# Patient Record
Sex: Female | Born: 1985 | Race: Black or African American | Hispanic: No | Marital: Single | Smoking: Never smoker
Health system: Southern US, Community
[De-identification: ages and names within clinical notes are randomized; demographics above are authoritative.]

## PROBLEM LIST (undated history)

## (undated) ENCOUNTER — Inpatient Hospital Stay (HOSPITAL_COMMUNITY): Payer: Self-pay

## (undated) DIAGNOSIS — O139 Gestational [pregnancy-induced] hypertension without significant proteinuria, unspecified trimester: Secondary | ICD-10-CM

## (undated) HISTORY — PX: MOUTH SURGERY: SHX715

---
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
## 2007-12-16 ENCOUNTER — Emergency Department (HOSPITAL_COMMUNITY): Admission: EM | Admit: 2007-12-16 | Discharge: 2007-12-16 | Payer: Self-pay | Admitting: Emergency Medicine

## 2009-06-30 DIAGNOSIS — O321XX0 Maternal care for breech presentation, not applicable or unspecified: Secondary | ICD-10-CM

## 2010-08-28 ENCOUNTER — Emergency Department (HOSPITAL_COMMUNITY): Payer: Self-pay

## 2010-08-28 ENCOUNTER — Emergency Department (HOSPITAL_COMMUNITY)
Admission: EM | Admit: 2010-08-28 | Discharge: 2010-08-29 | Disposition: A | Discharge status: Other Acute Inpatient Hospital | Payer: Self-pay | Source: Home / Self Care | Attending: Emergency Medicine | Admitting: Emergency Medicine

## 2010-08-28 DIAGNOSIS — IMO0002 Reserved for concepts with insufficient information to code with codable children: Secondary | ICD-10-CM | POA: Insufficient documentation

## 2010-08-28 DIAGNOSIS — A499 Bacterial infection, unspecified: Secondary | ICD-10-CM | POA: Insufficient documentation

## 2010-08-28 DIAGNOSIS — N390 Urinary tract infection, site not specified: Secondary | ICD-10-CM | POA: Insufficient documentation

## 2010-08-28 DIAGNOSIS — N949 Unspecified condition associated with female genital organs and menstrual cycle: Secondary | ICD-10-CM | POA: Insufficient documentation

## 2010-08-28 DIAGNOSIS — O009 Unspecified ectopic pregnancy without intrauterine pregnancy: Secondary | ICD-10-CM | POA: Insufficient documentation

## 2010-08-28 DIAGNOSIS — B9689 Other specified bacterial agents as the cause of diseases classified elsewhere: Secondary | ICD-10-CM | POA: Insufficient documentation

## 2010-08-28 DIAGNOSIS — N760 Acute vaginitis: Secondary | ICD-10-CM | POA: Insufficient documentation

## 2010-08-28 LAB — URINALYSIS, ROUTINE W REFLEX MICROSCOPIC
Ketones, ur: 15 mg/dL — AB
Nitrite: NEGATIVE
Protein, ur: 30 mg/dL — AB
Urobilinogen, UA: 1 mg/dL (ref 0.0–1.0)
pH: 6 (ref 5.0–8.0)

## 2010-08-28 LAB — COMPREHENSIVE METABOLIC PANEL
AST: 19 U/L (ref 0–37)
Albumin: 3.8 g/dL (ref 3.5–5.2)
BUN: 9 mg/dL (ref 6–23)
Chloride: 103 mEq/L (ref 96–112)
Creatinine, Ser: 0.65 mg/dL (ref 0.4–1.2)
GFR calc Af Amer: >60 mL/min (ref >60)
Potassium: 3.7 mEq/L (ref 3.5–5.1)
Total Protein: 8.2 g/dL (ref 6.0–8.3)

## 2010-08-28 LAB — HCG, QUANTITATIVE, PREGNANCY: hCG, Beta Chain, Quant, S: 1371 m[IU]/mL — ABNORMAL HIGH (ref <5)

## 2010-08-28 LAB — WET PREP, GENITAL

## 2010-08-28 LAB — CBC
HCT: 32.2 % — ABNORMAL LOW (ref 36.0–46.0)
Hemoglobin: 10 g/dL — ABNORMAL LOW (ref 12.0–15.0)
MCH: 22.1 pg — ABNORMAL LOW (ref 26.0–34.0)
MCHC: 31.1 g/dL (ref 30.0–36.0)
RBC: 4.52 MIL/uL (ref 3.87–5.11)

## 2010-08-28 LAB — DIFFERENTIAL
Basophils Relative: 0 % (ref 0–1)
Lymphocytes Relative: 33 % (ref 12–46)
Lymphs Abs: 2.1 10*3/uL (ref 0.7–4.0)
Monocytes Absolute: 0.6 10*3/uL (ref 0.1–1.0)
Monocytes Relative: 9 % (ref 3–12)
Neutro Abs: 3.5 10*3/uL (ref 1.7–7.7)
Neutrophils Relative %: 57 % (ref 43–77)

## 2010-08-28 LAB — ABO/RH: ABO/RH(D): O POS

## 2010-08-28 LAB — POCT PREGNANCY, URINE: Preg Test, Ur: POSITIVE

## 2010-08-29 ENCOUNTER — Inpatient Hospital Stay (HOSPITAL_COMMUNITY)
Admission: AD | Admit: 2010-08-29 | Discharge: 2010-08-29 | DRG: 781 | Disposition: A | Discharge status: Home / Self Care | Payer: Self-pay | Source: Ambulatory Visit | Attending: Obstetrics and Gynecology | Admitting: Obstetrics and Gynecology

## 2010-08-29 DIAGNOSIS — R109 Unspecified abdominal pain: Secondary | ICD-10-CM | POA: Diagnosis present

## 2010-08-29 DIAGNOSIS — O2390 Unspecified genitourinary tract infection in pregnancy, unspecified trimester: Principal | ICD-10-CM | POA: Diagnosis present

## 2010-08-29 DIAGNOSIS — B9689 Other specified bacterial agents as the cause of diseases classified elsewhere: Secondary | ICD-10-CM | POA: Diagnosis present

## 2010-08-29 DIAGNOSIS — N390 Urinary tract infection, site not specified: Secondary | ICD-10-CM | POA: Diagnosis present

## 2010-08-29 DIAGNOSIS — O99019 Anemia complicating pregnancy, unspecified trimester: Secondary | ICD-10-CM | POA: Diagnosis present

## 2010-08-29 DIAGNOSIS — D649 Anemia, unspecified: Secondary | ICD-10-CM | POA: Diagnosis present

## 2010-08-29 DIAGNOSIS — N760 Acute vaginitis: Secondary | ICD-10-CM | POA: Diagnosis present

## 2010-08-29 DIAGNOSIS — A499 Bacterial infection, unspecified: Secondary | ICD-10-CM | POA: Diagnosis present

## 2010-08-29 DIAGNOSIS — O00109 Unspecified tubal pregnancy without intrauterine pregnancy: Secondary | ICD-10-CM | POA: Diagnosis present

## 2010-08-29 LAB — COMPREHENSIVE METABOLIC PANEL
ALT: 12 U/L (ref 0–35)
Alkaline Phosphatase: 47 U/L (ref 39–117)
CO2: 28 mEq/L (ref 19–32)
Chloride: 103 mEq/L (ref 96–112)
GFR calc non Af Amer: >60 mL/min (ref >60)
Glucose, Bld: 90 mg/dL (ref 70–99)
Potassium: 3.7 mEq/L (ref 3.5–5.1)
Sodium: 136 mEq/L (ref 135–145)
Total Bilirubin: 0.4 mg/dL (ref 0.3–1.2)
Total Protein: 7.3 g/dL (ref 6.0–8.3)

## 2010-08-29 LAB — CBC
HCT: 29.9 % — ABNORMAL LOW (ref 36.0–46.0)
Hemoglobin: 9 g/dL — ABNORMAL LOW (ref 12.0–15.0)
MCH: 21.9 pg — ABNORMAL LOW (ref 26.0–34.0)
MCV: 72.7 fL — ABNORMAL LOW (ref 78.0–100.0)
RBC: 4.11 MIL/uL (ref 3.87–5.11)
WBC: 4.1 10*3/uL (ref 4.0–10.5)

## 2010-08-29 LAB — DIFFERENTIAL
Lymphocytes Relative: 39 % (ref 12–46)
Lymphs Abs: 1.6 10*3/uL (ref 0.7–4.0)
Monocytes Relative: 14 % — ABNORMAL HIGH (ref 3–12)
Neutrophils Relative %: 46 % (ref 43–77)

## 2010-08-30 LAB — URINE CULTURE

## 2010-08-30 LAB — GC/CHLAMYDIA PROBE AMP, GENITAL: Chlamydia, DNA Probe: NEGATIVE

## 2010-09-04 ENCOUNTER — Inpatient Hospital Stay (HOSPITAL_COMMUNITY)
Admission: AD | Admit: 2010-09-04 | Discharge: 2010-09-04 | Disposition: A | Discharge status: Home / Self Care | Payer: Self-pay | Source: Ambulatory Visit | Attending: Obstetrics and Gynecology | Admitting: Obstetrics and Gynecology

## 2010-09-04 ENCOUNTER — Inpatient Hospital Stay (HOSPITAL_COMMUNITY): Payer: Self-pay

## 2010-09-04 DIAGNOSIS — R109 Unspecified abdominal pain: Secondary | ICD-10-CM

## 2010-09-04 DIAGNOSIS — O9989 Other specified diseases and conditions complicating pregnancy, childbirth and the puerperium: Secondary | ICD-10-CM

## 2010-09-04 DIAGNOSIS — O99891 Other specified diseases and conditions complicating pregnancy: Secondary | ICD-10-CM | POA: Insufficient documentation

## 2010-09-04 LAB — CBC
HCT: 30.3 % — ABNORMAL LOW (ref 36.0–46.0)
RBC: 4.16 MIL/uL (ref 3.87–5.11)
RDW: 17.8 % — ABNORMAL HIGH (ref 11.5–15.5)
WBC: 5.4 10*3/uL (ref 4.0–10.5)

## 2011-02-20 ENCOUNTER — Inpatient Hospital Stay (INDEPENDENT_AMBULATORY_CARE_PROVIDER_SITE_OTHER)
Admission: RE | Admit: 2011-02-20 | Discharge: 2011-02-20 | Disposition: A | Discharge status: Home / Self Care | Payer: Self-pay | Source: Ambulatory Visit | Attending: Family Medicine | Admitting: Family Medicine

## 2011-02-20 DIAGNOSIS — L538 Other specified erythematous conditions: Secondary | ICD-10-CM

## 2011-02-20 DIAGNOSIS — N390 Urinary tract infection, site not specified: Secondary | ICD-10-CM

## 2011-02-20 LAB — POCT URINALYSIS DIP (DEVICE)
Bilirubin Urine: NEGATIVE
Glucose, UA: NEGATIVE mg/dL
Ketones, ur: NEGATIVE mg/dL
Nitrite: NEGATIVE

## 2012-03-16 IMAGING — US US OB TRANSVAGINAL
1 series · 13 of 28 positions shown · non-contrast
Comparison: None

CLINICAL DATA: Early pregnancy. Left lower quadrant pain.

OBSTETRIC <14 WK US AND TRANSVAGINAL OB US
TECHNIQUE: Both transabdominal and transvaginal ultrasound
examinations were performed for complete evaluation of the
gestation as well as the maternal uterus, adnexal regions, and
pelvic cul-de-sac.  Transvaginal technique was performed to assess
early pregnancy.

[Series 1: us ob transvaginal · 0.21mm/px · 13 of 64 slices shown]
[im 3/64]
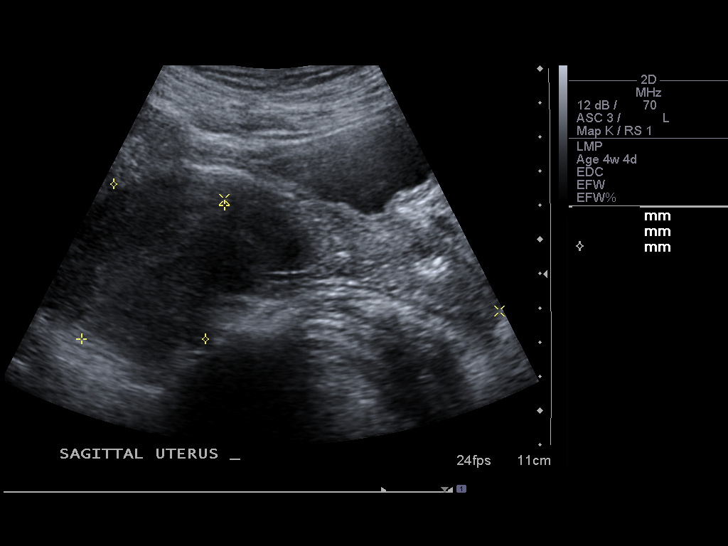
[im 8/64]
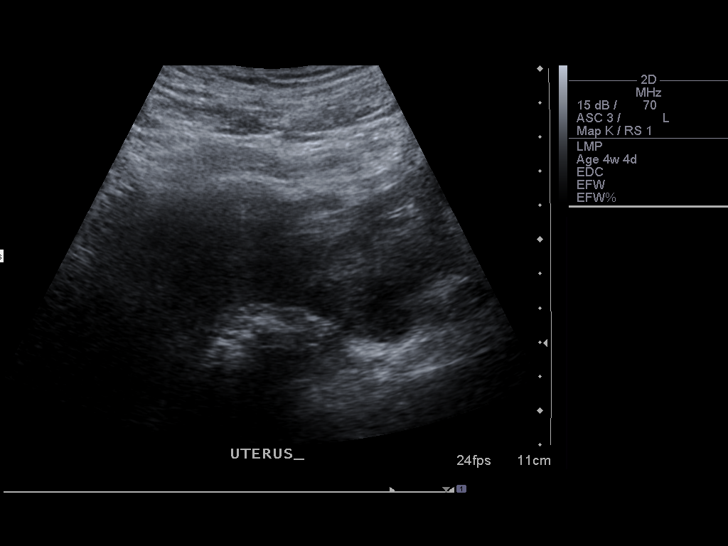
[im 12/64]
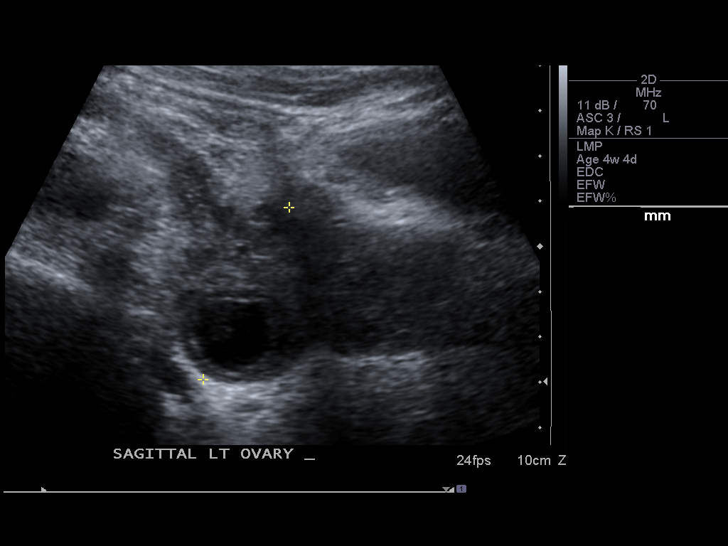
[im 17/64]
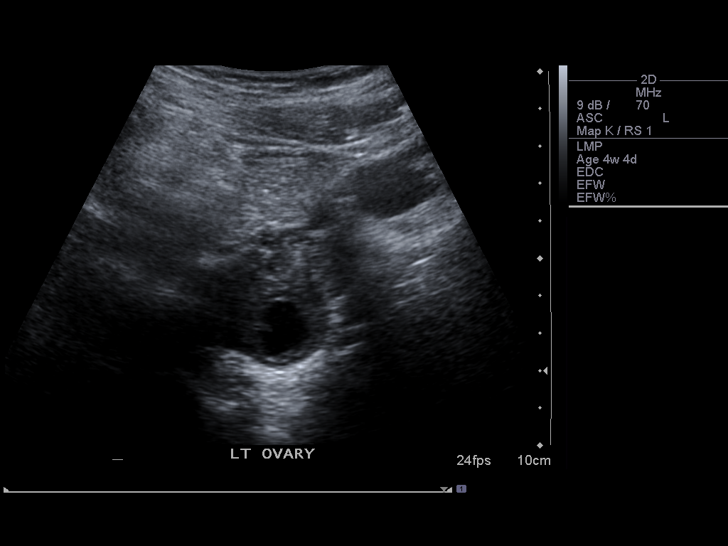
[im 22/64]
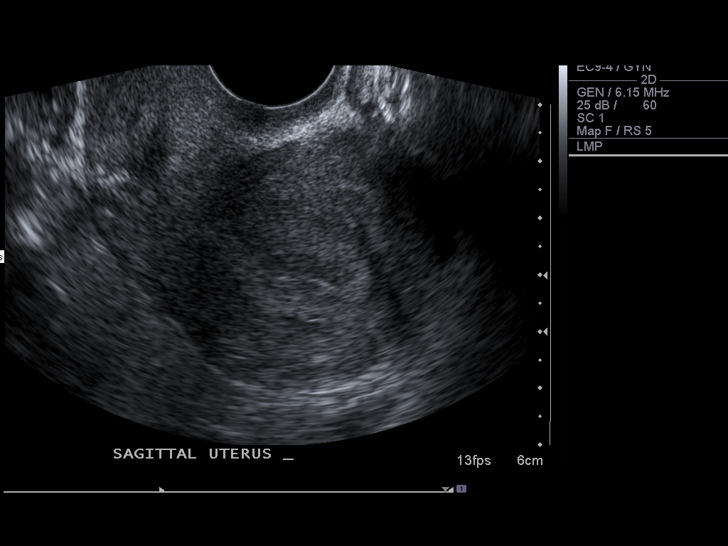
[im 26/64]
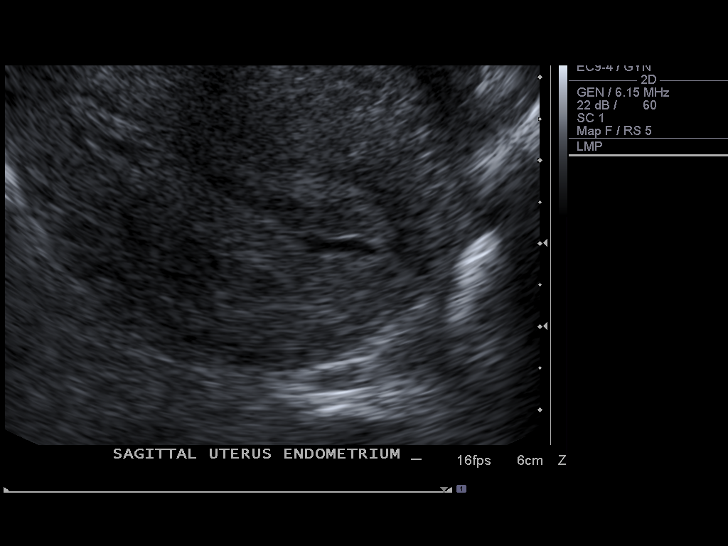
[im 33/64]
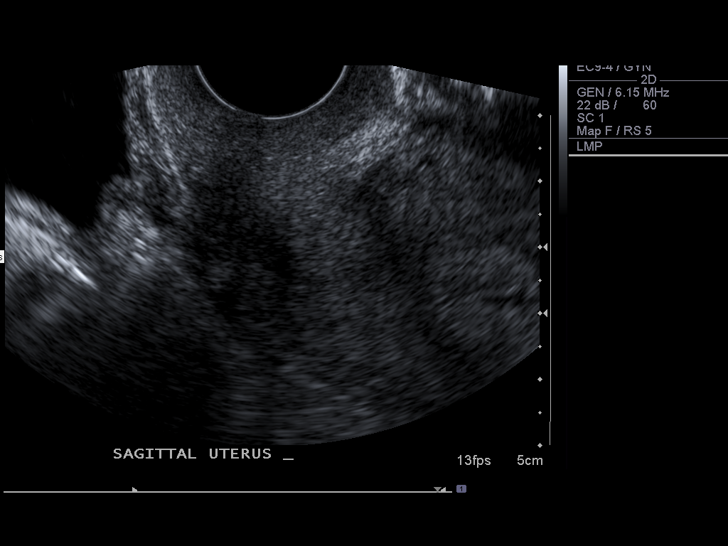
[im 38/64]
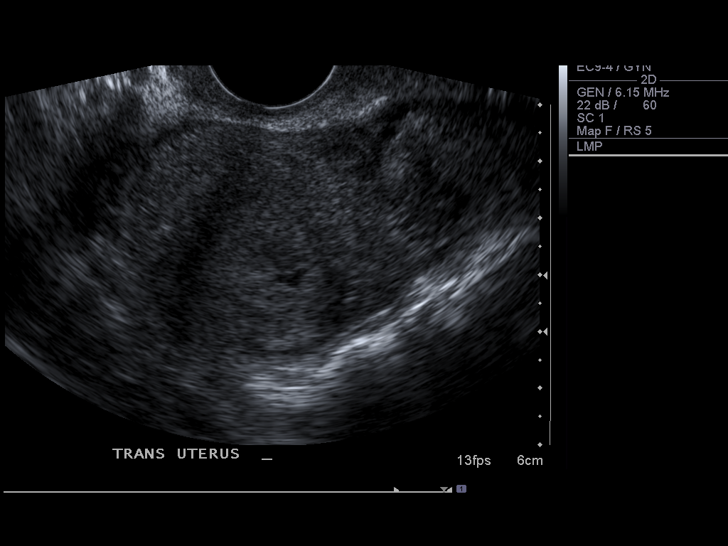
[im 43/64]
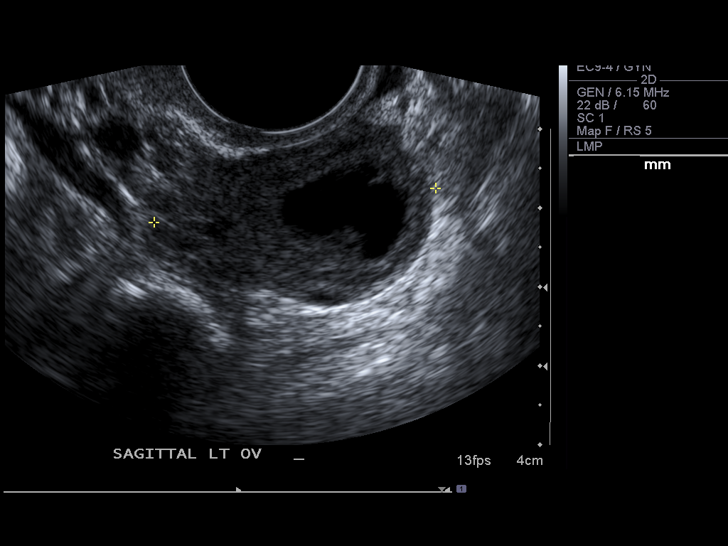
[im 47/64]
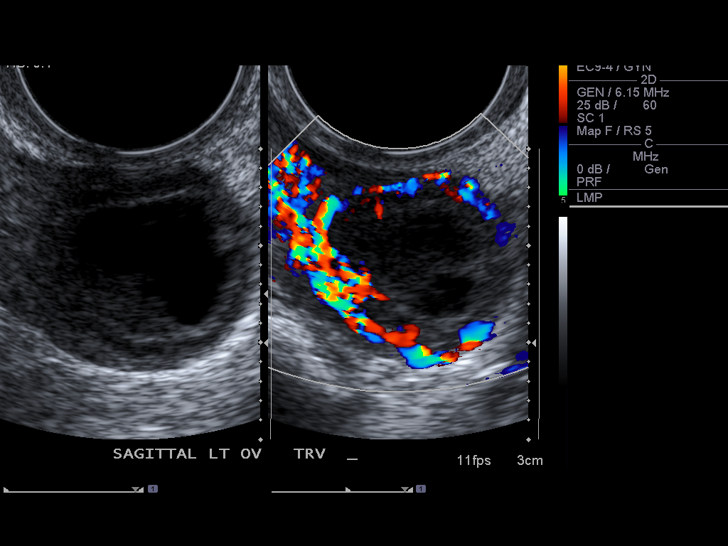
[im 52/64]
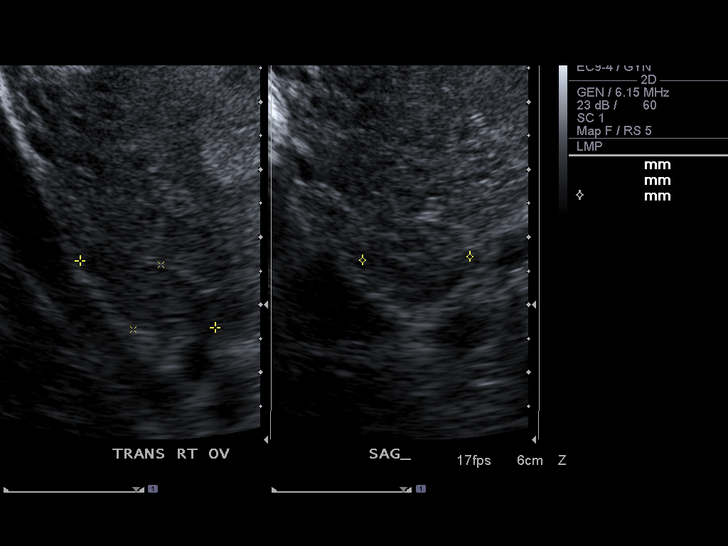
[im 57/64]
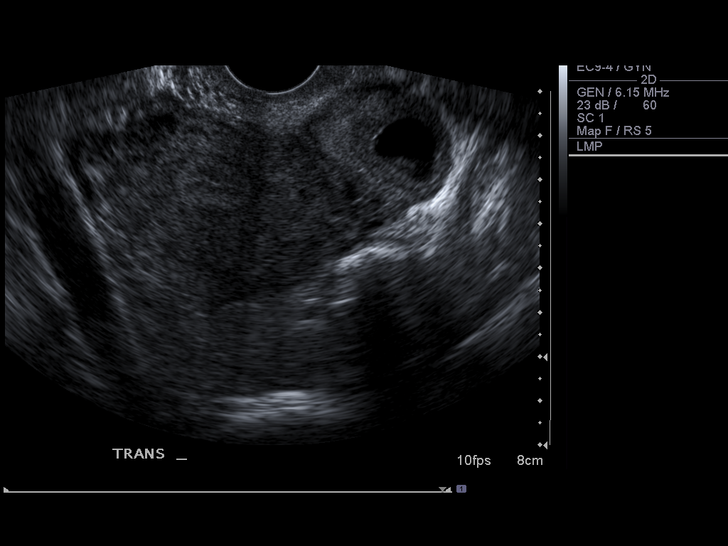
[im 61/64]
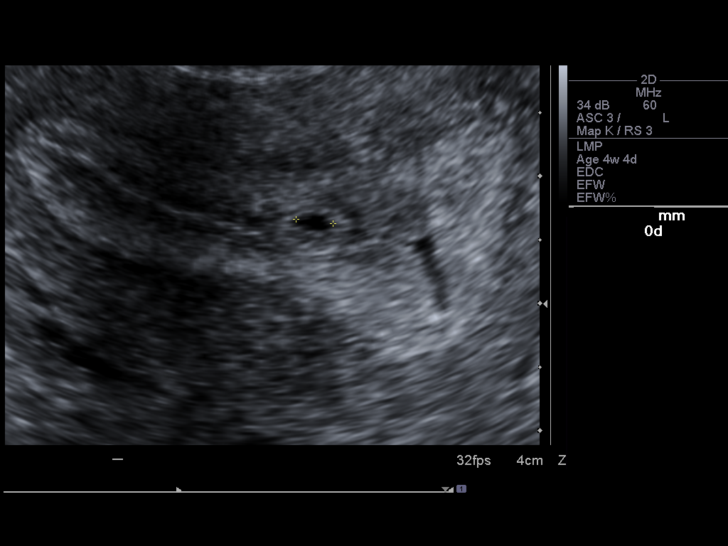

[13 of 28 positions shown; findings below may reference images not displayed]

There is no definite intrauterine gestational sac.  There is a tiny
amount of fluid in the endometrial canal.  There is a 2 x 3 mm bit
of fluid within the endometrium that could represent the earliest
manifestation of a gestational sac.  There is no discernible
contents within that.

The right ovary is normal measuring 2.2 x 1.0 x 1.6 cm.  Normal
appearing blood flow.

The left ovary contains a thick-walled cystic area.  The ovary and
total measures 3.6 x 3.6 x 2.3 cm.  The cystic area measures 2 x
1.5 x 1.9 cm.  There is prominent blood flow around the margin of
this.  This could represent a functional cyst or a corpus luteal
cyst.  The possibility of an ovarian ectopic does exist.  Follow up
should be performed.  There is a small amount of free fluid.
IMPRESSION: No definite intrauterine gestational sac.  There is a small amount
of free type fluid within the endometrial canal.  There is a 2 x 3
mm fluid area associated with the endometrium that could represent
the earliest manifestation of gestational sac. This is not
definite.  There are no internal contents identifiable at this
time.

Abnormal appearance of the left ovary.  There is a thick walled
peripherally hypervascular cystic area measuring 2 x 1.5 x 1.9 cm.
This could represent a functional cyst or corpus luteal cyst or
could possibly represent an ovarian ectopic.

Only a small amount of free fluid in the cul-de-sac.

Further close follow-up is warranted.

## 2012-08-22 ENCOUNTER — Encounter (HOSPITAL_COMMUNITY): Payer: Self-pay | Admitting: Emergency Medicine

## 2012-08-22 ENCOUNTER — Emergency Department (INDEPENDENT_AMBULATORY_CARE_PROVIDER_SITE_OTHER)
Admission: EM | Admit: 2012-08-22 | Discharge: 2012-08-22 | Disposition: A | Discharge status: Home / Self Care | Payer: Self-pay | Source: Home / Self Care | Attending: Family Medicine | Admitting: Family Medicine

## 2012-08-22 DIAGNOSIS — L42 Pityriasis rosea: Secondary | ICD-10-CM

## 2012-08-22 MED ORDER — TRIAMCINOLONE ACETONIDE 0.5 % EX OINT: TOPICAL_OINTMENT | Freq: Two times a day (BID) | CUTANEOUS | Status: DC

## 2012-08-22 MED ORDER — VALACYCLOVIR HCL 500 MG PO TABS: 500.0000 mg | ORAL_TABLET | Freq: Two times a day (BID) | ORAL | Status: DC

## 2012-08-22 MED ORDER — HYDROXYZINE HCL 50 MG PO TABS: 50.0000 mg | ORAL_TABLET | Freq: Three times a day (TID) | ORAL | Status: DC | PRN

## 2012-08-22 NOTE — ED Notes (Signed)
Pt c/o rash on torso x 3 days. Has tried oatmeal scrub with no relief. Spots seem to be getting bigger. Very red and itchy. Used Gain laundry detergent which is new for her. Not sure if this is due to switch in detergent. No SOB or fever. Pt is alert and oriented.

## 2012-08-29 NOTE — ED Provider Notes (Signed)
History     CSN: [id number]  Arrival date & time 08/22/12  1042   First MD Initiated Contact with Patient 08/22/12 1102      Chief Complaint  Patient presents with  . Rash    (Consider location/radiation/quality/duration/timing/severity/associated sxs/prior treatment) Patient is a 27 y.o. female presenting with rash. The history is provided by the patient.  Rash Location:  Torso Torso rash location:  Upper back, lower back, L chest and R chest Quality: itchiness and redness   Quality: not blistering, not draining and not weeping   Severity:  Moderate Onset quality:  Sudden Duration:  3 days Timing:  Constant Progression:  Unchanged Chronicity:  New Context: new detergent/soap   Context: not chemical exposure, not exposure to similar rash, not food and not medications   Relieved by:  Anti-itch cream Ineffective treatments:  Moisturizers Associated symptoms: no abdominal pain, no fatigue, no fever, no headaches, no joint pain, no nausea, no shortness of breath, no sore throat, no throat swelling, no tongue swelling and not wheezing     History reviewed. No pertinent past medical history.  Past Surgical History  Procedure Laterality Date  . Cesarean section      No family history on file.  History  Substance Use Topics  . Smoking status: Never Smoker   . Smokeless tobacco: Not on file  . Alcohol Use: No    OB History   Grav Para Term Preterm Abortions TAB SAB Ect Mult Living                  Review of Systems  Constitutional: Negative for fever, chills, appetite change and fatigue.  HENT: Negative for sore throat.   Eyes: Negative for itching.  Respiratory: Negative for shortness of breath and wheezing.   Gastrointestinal: Negative for nausea and abdominal pain.  Musculoskeletal: Negative for arthralgias.  Skin: Positive for rash.  Neurological: Negative for headaches.  All other systems reviewed and are negative.    Allergies  Review of patient's  allergies indicates no known allergies.  Home Medications   Current Outpatient Rx  Name  Route  Sig  Dispense  Refill  . hydrOXYzine (ATARAX/VISTARIL) 50 MG tablet   Oral   Take 1 tablet (50 mg total) by mouth every 8 (eight) hours as needed for itching.   20 tablet   0   . triamcinolone ointment (KENALOG) 0.5 %   Topical   Apply topically 2 (two) times daily.   30 g   0   . valACYclovir (VALTREX) 500 MG tablet   Oral   Take 1 tablet (500 mg total) by mouth 2 (two) times daily.   14 tablet   0     BP 136/70  Pulse 82  Temp(Src) 98.1 F (36.7 C) (Oral)  Resp 16  SpO2 100%  LMP 07/26/2012  Physical Exam  Vitals reviewed. Constitutional: She is oriented to person, place, and time. She appears well-developed and well-nourished. No distress.  HENT:  Mouth/Throat: Oropharynx is clear and moist. No oropharyngeal exudate.  Eyes: Conjunctivae are normal. Right eye exhibits no discharge. Left eye exhibits no discharge. No scleral icterus.  Neck: No thyromegaly present.  Cardiovascular: Normal heart sounds.   Pulmonary/Chest: Breath sounds normal.  Abdominal: Soft. She exhibits no mass. There is no tenderness.  Lymphadenopathy:    She has no cervical adenopathy.  Neurological: She is alert and oriented to person, place, and time.  Skin: She is not diaphoretic.  Macular pruriginous patches of different size  in christmas tree pattern distribution over upper and lower back and anterior torso. Also few lesions in proximal extremities. No palm or sole involvement. No vesicles. No pustules.     ED Course  Procedures (including critical care time)  Labs Reviewed - No data to display No results found.   1. Pityriasis rosea       MDM  Treated with vistaril, triamcinolone ointment and valacyclovir. Supportive care and red flags that should prompt return to medical attention discussed with patient and provided in writing.         Sharin Grave, MD 08/29/12  [phone]

## 2013-01-23 ENCOUNTER — Encounter (HOSPITAL_COMMUNITY): Payer: Self-pay | Admitting: Family Medicine

## 2013-01-23 ENCOUNTER — Emergency Department (HOSPITAL_COMMUNITY)
Admission: EM | Admit: 2013-01-23 | Discharge: 2013-01-23 | Disposition: A | Discharge status: Home / Self Care | Payer: Self-pay | Attending: Emergency Medicine | Admitting: Emergency Medicine

## 2013-01-23 ENCOUNTER — Emergency Department (HOSPITAL_COMMUNITY): Payer: Self-pay

## 2013-01-23 ENCOUNTER — Emergency Department (INDEPENDENT_AMBULATORY_CARE_PROVIDER_SITE_OTHER)
Admission: EM | Admit: 2013-01-23 | Discharge: 2013-01-23 | Disposition: A | Discharge status: Nursing Home (Includes ICF) | Payer: Self-pay | Source: Home / Self Care | Attending: Family Medicine | Admitting: Family Medicine

## 2013-01-23 ENCOUNTER — Encounter (HOSPITAL_COMMUNITY): Payer: Self-pay | Admitting: Emergency Medicine

## 2013-01-23 DIAGNOSIS — Z79899 Other long term (current) drug therapy: Secondary | ICD-10-CM | POA: Insufficient documentation

## 2013-01-23 DIAGNOSIS — N949 Unspecified condition associated with female genital organs and menstrual cycle: Secondary | ICD-10-CM | POA: Insufficient documentation

## 2013-01-23 DIAGNOSIS — R102 Pelvic and perineal pain: Secondary | ICD-10-CM

## 2013-01-23 DIAGNOSIS — N898 Other specified noninflammatory disorders of vagina: Secondary | ICD-10-CM | POA: Insufficient documentation

## 2013-01-23 DIAGNOSIS — R110 Nausea: Secondary | ICD-10-CM | POA: Insufficient documentation

## 2013-01-23 LAB — COMPREHENSIVE METABOLIC PANEL
Alkaline Phosphatase: 52 U/L (ref 39–117)
BUN: 9 mg/dL (ref 6–23)
CO2: 25 mEq/L (ref 19–32)
Chloride: 101 mEq/L (ref 96–112)
Creatinine, Ser: 0.68 mg/dL (ref 0.50–1.10)
GFR calc non Af Amer: >90 mL/min (ref >90)
Glucose, Bld: 88 mg/dL (ref 70–99)
Potassium: 4 mEq/L (ref 3.5–5.1)
Total Bilirubin: 0.2 mg/dL — ABNORMAL LOW (ref 0.3–1.2)

## 2013-01-23 LAB — URINALYSIS, ROUTINE W REFLEX MICROSCOPIC
Glucose, UA: NEGATIVE mg/dL
Hgb urine dipstick: NEGATIVE
Ketones, ur: 15 mg/dL — AB
Protein, ur: NEGATIVE mg/dL
Urobilinogen, UA: 0.2 mg/dL (ref 0.0–1.0)

## 2013-01-23 LAB — CBC WITH DIFFERENTIAL/PLATELET
HCT: 35.4 % — ABNORMAL LOW (ref 36.0–46.0)
Hemoglobin: 11.1 g/dL — ABNORMAL LOW (ref 12.0–15.0)
Lymphocytes Relative: 37 % (ref 12–46)
Lymphs Abs: 2.1 10*3/uL (ref 0.7–4.0)
MCHC: 31.4 g/dL (ref 30.0–36.0)
Monocytes Absolute: 0.5 10*3/uL (ref 0.1–1.0)
Monocytes Relative: 8 % (ref 3–12)
Neutro Abs: 3.1 10*3/uL (ref 1.7–7.7)
Neutrophils Relative %: 53 % (ref 43–77)
RBC: 4.6 MIL/uL (ref 3.87–5.11)
WBC: 5.8 10*3/uL (ref 4.0–10.5)

## 2013-01-23 LAB — POCT URINALYSIS DIP (DEVICE)
Bilirubin Urine: NEGATIVE
Ketones, ur: NEGATIVE mg/dL
Protein, ur: NEGATIVE mg/dL
Specific Gravity, Urine: 1.02 (ref 1.005–1.030)

## 2013-01-23 LAB — URINE MICROSCOPIC-ADD ON

## 2013-01-23 LAB — POCT PREGNANCY, URINE: Preg Test, Ur: NEGATIVE

## 2013-01-23 MED ORDER — HYDROCODONE-ACETAMINOPHEN 5-325 MG PO TABS: 1.0000 | ORAL_TABLET | ORAL | Status: DC | PRN

## 2013-01-23 NOTE — ED Provider Notes (Signed)
CSN: [id number]     Arrival date & time 01/23/13  1119 History   First MD Initiated Contact with Patient 01/23/13 1218     Chief Complaint  Patient presents with  . Abdominal Pain   (Consider location/radiation/quality/duration/timing/severity/associated sxs/prior Treatment) HPI Patient referred from urgent care center for left lower quadrant pain for the past 3 days. Patient says pain came on suddenly but has waxed and waned. The pain is worse with movement and abdominal extension. She has no fevers or chills. She states that the pain is extreme but she does have some nausea. She denies any urinary symptoms. She sways late for her menstrual cycle. She's had a cesarean section in the past no other abdominal surgeries. Just has a history of left ovarian cyst but reportedly feels very similar to this. History reviewed. No pertinent past medical history. Past Surgical History  Procedure Laterality Date  . Cesarean section     History reviewed. No pertinent family history. History  Substance Use Topics  . Smoking status: Never Smoker   . Smokeless tobacco: Not on file  . Alcohol Use: No   OB History   Grav Para Term Preterm Abortions TAB SAB Ect Mult Living                 Review of Systems  Constitutional: Negative for fever and chills.  Respiratory: Negative for shortness of breath.   Cardiovascular: Negative for chest pain.  Gastrointestinal: Positive for nausea and abdominal pain. Negative for vomiting, diarrhea and constipation.  Genitourinary: Positive for vaginal discharge. Negative for dysuria, hematuria, flank pain and vaginal bleeding.  Musculoskeletal: Negative for back pain.  Skin: Negative for rash and wound.  Neurological: Negative for dizziness, seizures, syncope, weakness, light-headedness, numbness and headaches.  All other systems reviewed and are negative.    Allergies  Review of patient's allergies indicates no known allergies.  Home Medications   Current  Outpatient Rx  Name  Route  Sig  Dispense  Refill  . hydrOXYzine (ATARAX/VISTARIL) 50 MG tablet   Oral   Take 1 tablet (50 mg total) by mouth every 8 (eight) hours as needed for itching.   20 tablet   0   . triamcinolone ointment (KENALOG) 0.5 %   Topical   Apply topically 2 (two) times daily.   30 g   0   . valACYclovir (VALTREX) 500 MG tablet   Oral   Take 1 tablet (500 mg total) by mouth 2 (two) times daily.   14 tablet   0    BP 157/75  Pulse 76  Temp(Src) 98.2 F (36.8 C) (Oral)  Resp 20  Ht 5\' 9"  (1.753 m)  Wt 187 lb (84.823 kg)  BMI 27.6 kg/m2  SpO2 100%  LMP 12/17/2012 Physical Exam  Nursing note and vitals reviewed. Constitutional: She is oriented to person, place, and time. She appears well-developed and well-nourished. No distress.  HENT:  Head: Normocephalic and atraumatic.  Mouth/Throat: Oropharynx is clear and moist.  Eyes: EOM are normal. Pupils are equal, round, and reactive to light.  Neck: Normal range of motion. Neck supple.  Cardiovascular: Normal rate and regular rhythm.   Pulmonary/Chest: Effort normal and breath sounds normal. No respiratory distress. She has no wheezes. She has no rales.  Abdominal: Soft. Bowel sounds are normal. She exhibits no distension and no mass. There is tenderness (tenderness to palpation in the left adnexal region more so than the left lower quadrant. She has no rebound or guarding. No appreciated  masses.). There is no rebound and no guarding.  Genitourinary: Vaginal discharge found.  Visual no cervical motion tenderness. She has moderate left adnexal tenderness. No appreciated masses. Normal white vaginal discharge.  Musculoskeletal: Normal range of motion. She exhibits no edema and no tenderness.  Neurological: She is alert and oriented to person, place, and time.  Skin: Skin is warm and dry. No rash noted. No erythema.  Psychiatric: She has a normal mood and affect. Her behavior is normal.    ED Course  Procedures  (including critical care time) Labs Review Labs Reviewed  GC/CHLAMYDIA PROBE AMP  WET PREP, GENITAL  CBC WITH DIFFERENTIAL  COMPREHENSIVE METABOLIC PANEL  URINALYSIS, ROUTINE W REFLEX MICROSCOPIC   Imaging Review No results found.  MDM  A pelvic exam and get a pelvic ultrasound. To rule out any type of ovarian torsion. Pregnancy test done in the urgent care Center is negative. Patient with likely ovarian cyst rupture. No evidence of torsion. Labs are unremarkable. I do not think the patient has an intra-abdominal process. We'll treat her pain discharge her home and have her follow up with OB/GYN.  Loren Racer, MD 01/23/13 1447

## 2013-01-23 NOTE — ED Notes (Signed)
Pt c/o LLQ abd pain onset 3 days... Reports hx of ovarian cyst on left  Pain radiates to middle of abd and increases w/activity or when she sits a certain way Also states she is late on her menstrual cycle... LMP = 8/18 Denies: f/v/n/d, vag d/c, hematuria, dysuria Alert w/no signs of acute distress.

## 2013-01-23 NOTE — ED Notes (Signed)
Pt sent here from [facility] for [place]. complaining of LLQ pain with nausea and hx of ovarian cyst. Denies V,D. Denies vaginal bleeding or discharge. sts pain increased with movement.

## 2013-01-23 NOTE — ED Provider Notes (Signed)
CSN: [id number]     Arrival date & time 01/23/13  5284 History   First MD Initiated Contact with Patient 01/23/13 0957     Chief Complaint  Patient presents with  . Abdominal Pain   (Consider location/radiation/quality/duration/timing/severity/associated sxs/prior Treatment) HPI Comments: 27 y/o female with no significant past medical history. [id number]. H/o delivery by C-section. Here c/o LLQ worsening pain for 3 days associated with nausea, no vomiting. Pain worse with movement. Had a pink vaginal discharge last week but denies current vaginal bleeding or vaginal discharge. last bowel movement normal as per her report yesterday and today. Denies fever or chills, also denies dysuria or other urinary symptoms. Reports 4 days delay of her menstrual period and admits to unprotected sex in last 6 months. Patient reports similar symptoms recently for what she was seen at [facility] and had an ultrasound showing a functional left ovarian cyst as per her report.    History reviewed. No pertinent past medical history. Past Surgical History  Procedure Laterality Date  . Cesarean section     No family history on file. History  Substance Use Topics  . Smoking status: Never Smoker   . Smokeless tobacco: Not on file  . Alcohol Use: No   OB History   Grav Para Term Preterm Abortions TAB SAB Ect Mult Living                 Review of Systems  Constitutional: Negative for fever, chills, diaphoresis and fatigue.  Gastrointestinal: Positive for nausea and abdominal pain. Negative for vomiting, diarrhea, constipation, blood in stool and rectal pain.  Genitourinary: Positive for pelvic pain. Negative for dysuria, urgency, frequency, hematuria, vaginal bleeding, vaginal discharge and vaginal pain.  Neurological: Negative for dizziness and headaches.  All other systems reviewed and are negative.    Allergies  Review of patient's allergies indicates no known allergies.  Home Medications    Current Outpatient Rx  Name  Route  Sig  Dispense  Refill  . hydrOXYzine (ATARAX/VISTARIL) 50 MG tablet   Oral   Take 1 tablet (50 mg total) by mouth every 8 (eight) hours as needed for itching.   20 tablet   0   . triamcinolone ointment (KENALOG) 0.5 %   Topical   Apply topically 2 (two) times daily.   30 g   0   . valACYclovir (VALTREX) 500 MG tablet   Oral   Take 1 tablet (500 mg total) by mouth 2 (two) times daily.   14 tablet   0    BP 147/76  Pulse 77  Temp(Src) 98.3 F (36.8 C) (Oral)  Resp 16  SpO2 100%  LMP 12/17/2012 Physical Exam  Nursing note and vitals reviewed. Constitutional: She is oriented to person, place, and time. She appears well-developed and well-nourished.  Uncomfortable with movement.  HENT:  Head: Normocephalic and atraumatic.  Mouth/Throat: Oropharynx is clear and moist. No oropharyngeal exudate.  Eyes: No scleral icterus.  Neck: No tracheal deviation present.  Cardiovascular: Normal heart sounds.   Pulmonary/Chest: Breath sounds normal.  Abdominal: She exhibits no distension. There is tenderness. There is guarding.  LLQ tenderness with light palpation with guarding and impress rebound component although reported same degree of pain both with pressure and release. Patient unable to lay with legs extended on the examen table due to pain.   Neurological: She is alert and oriented to person, place, and time.  Skin: No rash noted. She is not diaphoretic.    ED Course  Procedures (including critical care time) Labs Review Labs Reviewed  POCT URINALYSIS DIP (DEVICE) - Abnormal; Notable for the following:    Nitrite POSITIVE (*)    All other components within normal limits  POCT PREGNANCY, URINE   Imaging Review No results found.  MDM   1. Pelvic pain    27 y/o female [id number]. Here c/o LLQ worsening pain for 3 days associated with nausea no vomiting, delayed menstrual period (4 days) and recent h/o of functional left ovarian  cyst. On exam: Vital signs are normal. Significant tenderness in left lower quadrant with guarding. Patient unable to lay with her leg straight in the exam table due to pain. Findings concerning for a ovarian torsion vs hemorrhagic or ruptured ovarian cyst or other intra-abdominal surgical pathology. Also possible early pregnancy. Decided to transfer to the emergency department for further evaluation and management.  Sharin Grave, MD 01/23/13 1109

## 2014-04-30 ENCOUNTER — Encounter (HOSPITAL_COMMUNITY): Payer: Self-pay

## 2014-04-30 ENCOUNTER — Inpatient Hospital Stay (HOSPITAL_COMMUNITY)
Admission: AD | Admit: 2014-04-30 | Discharge: 2014-04-30 | Disposition: A | Discharge status: Home / Self Care | Payer: Self-pay | Source: Ambulatory Visit | Attending: Obstetrics & Gynecology | Admitting: Obstetrics & Gynecology

## 2014-04-30 DIAGNOSIS — Z3201 Encounter for pregnancy test, result positive: Secondary | ICD-10-CM | POA: Insufficient documentation

## 2014-04-30 HISTORY — DX: Gestational (pregnancy-induced) hypertension without significant proteinuria, unspecified trimester: O13.9

## 2014-04-30 LAB — POCT PREGNANCY, URINE: Preg Test, Ur: POSITIVE — AB

## 2014-04-30 NOTE — MAU Note (Signed)
Urine in lab 

## 2014-04-30 NOTE — MAU Note (Signed)
Patient states she had a positive home pregnancy and wants confirmation. Denies pain, bleeding, discharge, nausea or vomiting.

## 2014-04-30 NOTE — MAU Provider Note (Signed)
S: 28 y.o. [id number] @[redacted]w[redacted]d  by LMP presents to MAU for pregnancy verification.  She denies abdominal pain or vaginal bleeding.    O: BP 137/74 mmHg  Pulse 84  Temp(Src) 99.1 F (37.3 C) (Oral)  Resp 16  Ht 5\' 9"  (1.753 m)  Wt 85.004 kg (187 lb 6.4 oz)  BMI 27.66 kg/m2  SpO2 100%  LMP 03/23/2014 Physical Examination: General appearance - alert, well appearing, and in no distress, oriented to person, place, and time and acyanotic, in no respiratory distress Results for orders placed or performed during the hospital encounter of 04/30/14 (from the past 24 hour(s))  Pregnancy, urine POC     Status: Abnormal   Collection Time: 04/30/14  2:33 PM  Result Value Ref Range   Preg Test, Ur POSITIVE (A) NEGATIVE   A: Positive pregnancy test  P: D/C home List of providers and pregnancy verification letter given F/U with early prenatal care Return to MAU as needed for emergencies  Sharen CounterLisa Leftwich-Kirby Certified Nurse-Midwife

## 2014-04-30 NOTE — MAU Note (Signed)
Patient outside when called to triage. Not in the waiting room

## 2014-05-02 NOTE — L&D Delivery Note (Cosign Needed)
Delivery Note At 10:16 AM a viable female was delivered via VBAC, Vacuum Assisted (3 pulls, one-pop-off) secondary to poor maternal effort / maternal exhaustion from the +2 station, (Presentation: ; Occiput Transverse).  APGAR: 9, 9; weight 7 lb 4.6 oz (3305 g).   Placenta status: Intact, Spontaneous.  Cord: 3 vessels with the following complications: PPH of 800 ml, likely secondary to retained placental membrane that was removed with ring forceps, uterus firm afterward, cytotec 1000 given rectally.  Cord pH: not obtained  Anesthesia: Epidural  Episiotomy: None Lacerations: 2nd degree Suture Repair: 3.0 vicryl Est. Blood Loss (mL): 843  Mom to postpartum.  Baby to Couplet care / Skin to Skin.  Cherrie Gauze Izear Pine 12/18/2014, 11:35 AM

## 2014-05-30 ENCOUNTER — Inpatient Hospital Stay (HOSPITAL_COMMUNITY)
Admission: AD | Admit: 2014-05-30 | Discharge: 2014-05-30 | Disposition: A | Discharge status: Home / Self Care | Payer: Self-pay | Source: Ambulatory Visit | Attending: Obstetrics and Gynecology | Admitting: Obstetrics and Gynecology

## 2014-05-30 ENCOUNTER — Inpatient Hospital Stay (HOSPITAL_COMMUNITY): Payer: Self-pay

## 2014-05-30 ENCOUNTER — Encounter (HOSPITAL_COMMUNITY): Payer: Self-pay | Admitting: *Deleted

## 2014-05-30 DIAGNOSIS — R109 Unspecified abdominal pain: Secondary | ICD-10-CM | POA: Insufficient documentation

## 2014-05-30 DIAGNOSIS — Z3491 Encounter for supervision of normal pregnancy, unspecified, first trimester: Secondary | ICD-10-CM

## 2014-05-30 DIAGNOSIS — O2341 Unspecified infection of urinary tract in pregnancy, first trimester: Secondary | ICD-10-CM

## 2014-05-30 DIAGNOSIS — O26899 Other specified pregnancy related conditions, unspecified trimester: Secondary | ICD-10-CM

## 2014-05-30 DIAGNOSIS — Z3A09 9 weeks gestation of pregnancy: Secondary | ICD-10-CM | POA: Insufficient documentation

## 2014-05-30 DIAGNOSIS — F17210 Nicotine dependence, cigarettes, uncomplicated: Secondary | ICD-10-CM | POA: Insufficient documentation

## 2014-05-30 DIAGNOSIS — O99331 Smoking (tobacco) complicating pregnancy, first trimester: Secondary | ICD-10-CM | POA: Insufficient documentation

## 2014-05-30 LAB — CBC
HCT: 32.7 % — ABNORMAL LOW (ref 36.0–46.0)
Hemoglobin: 10.8 g/dL — ABNORMAL LOW (ref 12.0–15.0)
MCH: 26.2 pg (ref 26.0–34.0)
MCHC: 33 g/dL (ref 30.0–36.0)
MCV: 79.2 fL (ref 78.0–100.0)
PLATELETS: 217 10*3/uL (ref 150–400)
RBC: 4.13 MIL/uL (ref 3.87–5.11)
RDW: 18 % — AB (ref 11.5–15.5)
WBC: 7.8 10*3/uL (ref 4.0–10.5)

## 2014-05-30 LAB — URINALYSIS, ROUTINE W REFLEX MICROSCOPIC
Bilirubin Urine: NEGATIVE
GLUCOSE, UA: NEGATIVE mg/dL
KETONES UR: NEGATIVE mg/dL
Nitrite: POSITIVE — AB
Protein, ur: NEGATIVE mg/dL
Specific Gravity, Urine: >1.03 — ABNORMAL HIGH (ref 1.005–1.030)
Urobilinogen, UA: 0.2 mg/dL (ref 0.0–1.0)
pH: 6 (ref 5.0–8.0)

## 2014-05-30 LAB — WET PREP, GENITAL
TRICH WET PREP: NONE SEEN
YEAST WET PREP: NONE SEEN

## 2014-05-30 LAB — URINE MICROSCOPIC-ADD ON

## 2014-05-30 LAB — GC/CHLAMYDIA PROBE AMP (~~LOC~~) NOT AT ARMC
Chlamydia: NEGATIVE
NEISSERIA GONORRHEA: NEGATIVE

## 2014-05-30 LAB — HCG, QUANTITATIVE, PREGNANCY: HCG, BETA CHAIN, QUANT, S: 225024 m[IU]/mL — AB (ref <5)

## 2014-05-30 MED ORDER — CEPHALEXIN 500 MG PO CAPS: 500.0000 mg | ORAL_CAPSULE | Freq: Four times a day (QID) | ORAL | Status: DC

## 2014-05-30 MED ORDER — PRENATAL VITAMINS 28-0.8 MG PO TABS: 1.0000 | ORAL_TABLET | Freq: Every day | ORAL | Status: AC

## 2014-05-30 NOTE — Discharge Instructions (Signed)
Pregnancy and Urinary Tract Infection °A urinary tract infection (UTI) is a bacterial infection of the urinary tract. Infection of the urinary tract can include the ureters, kidneys (pyelonephritis), bladder (cystitis), and urethra (urethritis). All pregnant women should be screened for bacteria in the urinary tract. Identifying and treating a UTI will decrease the risk of preterm labor and developing more serious infections in both the mother and baby. °CAUSES °Bacteria germs cause almost all UTIs.  °RISK FACTORS °Many factors can increase your chances of getting a UTI during pregnancy. These include: °· Having a short urethra. °· Poor toilet and hygiene habits. °· Sexual intercourse. °· Blockage of urine along the urinary tract. °· Problems with the pelvic muscles or nerves. °· Diabetes. °· Obesity. °· Bladder problems after having several children. °· Previous history of UTI. °SIGNS AND SYMPTOMS  °· Pain, burning, or a stinging feeling when urinating. °· Suddenly feeling the need to urinate right away (urgency). °· Loss of bladder control (urinary incontinence). °· Frequent urination, more than is common with pregnancy. °· Lower abdominal or back discomfort. °· Cloudy urine. °· Blood in the urine (hematuria). °· Fever.  °When the kidneys are infected, the symptoms may be: °· Back pain. °· Flank pain on the right side more so than the left. °· Fever. °· Chills. °· Nausea. °· Vomiting. °DIAGNOSIS  °A urinary tract infection is usually diagnosed through urine tests. Additional tests and procedures are sometimes done. These may include: °· Ultrasound exam of the kidneys, ureters, bladder, and urethra. °· Looking in the bladder with a lighted tube (cystoscopy). °TREATMENT °Typically, UTIs can be treated with antibiotic medicines.  °HOME CARE INSTRUCTIONS  °· Only take over-the-counter or prescription medicines as directed by your health care provider. If you were prescribed antibiotics, take them as directed. Finish  them even if you start to feel better. °· Drink enough fluids to keep your urine clear or pale yellow. °· Do not have sexual intercourse until the infection is gone and your health care provider says it is okay. °· Make sure you are tested for UTIs throughout your pregnancy. These infections often come back.  °Preventing a UTI in the Future °· Practice good toilet habits. Always wipe from front to back. Use the tissue only once. °· Do not hold your urine. Empty your bladder as soon as possible when the urge comes. °· Do not douche or use deodorant sprays. °· Wash with soap and warm water around the genital area and the anus. °· Empty your bladder before and after sexual intercourse. °· Wear underwear with a cotton crotch. °· Avoid caffeine and carbonated drinks. They can irritate the bladder. °· Drink cranberry juice or take cranberry pills. This may decrease the risk of getting a UTI. °· Do not drink alcohol. °· Keep all your appointments and tests as scheduled.  °SEEK MEDICAL CARE IF:  °· Your symptoms get worse. °· You are still having fevers 2 or more days after treatment begins. °· You have a rash. °· You feel that you are having problems with medicines prescribed. °· You have abnormal vaginal discharge. °SEEK IMMEDIATE MEDICAL CARE IF:  °· You have back or flank pain. °· You have chills. °· You have blood in your urine. °· You have nausea and vomiting. °· You have contractions of your uterus. °· You have a gush of fluid from the vagina. °MAKE SURE YOU: °· Understand these instructions.   °· Will watch your condition.   °· Will get help right away if you are not doing   well or get worse.  Document Released: 08/13/2010 Document Revised: 02/06/2013 Document Reviewed: 11/15/2012 [facility] Patient Information 2015 [place] [place]. This information is not intended to replace advice given to you by your health care provider. Make sure you discuss any questions you have with your health care provider.  First  Trimester of Pregnancy The first trimester of pregnancy is from week 1 until the end of week 12 (months 1 through 3). A week after a sperm fertilizes an egg, the egg will implant on the wall of the uterus. This embryo will begin to develop into a baby. Genes from you and your partner are forming the baby. The female genes determine whether the baby is a boy or a girl. At 6-8 weeks, the eyes and face are formed, and the heartbeat can be seen on ultrasound. At the end of 12 weeks, all the baby's organs are formed.  Now that you are pregnant, you will want to do everything you can to have a healthy baby. Two of the most important things are to get good prenatal care and to follow your health care provider's instructions. Prenatal care is all the medical care you receive before the baby's birth. This care will help prevent, find, and treat any problems during the pregnancy and childbirth. BODY CHANGES Your body goes through many changes during pregnancy. The changes vary from woman to woman.   You may gain or lose a couple of pounds at first.  You may feel sick to your stomach (nauseous) and throw up (vomit). If the vomiting is uncontrollable, call your health care provider.  You may tire easily.  You may develop headaches that can be relieved by medicines approved by your health care provider.  You may urinate more often. Painful urination may mean you have a bladder infection.  You may develop heartburn as a result of your pregnancy.  You may develop constipation because certain hormones are causing the muscles that push waste through your intestines to slow down.  You may develop hemorrhoids or swollen, bulging veins (varicose veins).  Your breasts may begin to grow larger and become tender. Your nipples may stick out more, and the tissue that surrounds them (areola) may become darker.  Your gums may bleed and may be sensitive to brushing and flossing.  Dark spots or blotches (chloasma, mask  of pregnancy) may develop on your face. This will likely fade after the baby is born.  Your menstrual periods will stop.  You may have a loss of appetite.  You may develop cravings for certain kinds of food.  You may have changes in your emotions from day to day, such as being excited to be pregnant or being concerned that something may go wrong with the pregnancy and baby.  You may have more vivid and strange dreams.  You may have changes in your hair. These can include thickening of your hair, rapid growth, and changes in texture. Some women also have hair loss during or after pregnancy, or hair that feels dry or thin. Your hair will most likely return to normal after your baby is born. WHAT TO EXPECT AT YOUR PRENATAL VISITS During a routine prenatal visit:  You will be weighed to make sure you and the baby are growing normally.  Your blood pressure will be taken.  Your abdomen will be measured to track your baby's growth.  The fetal heartbeat will be listened to starting around week 10 or 12 of your pregnancy.  Test results from any  previous visits will be discussed. Your health care provider may ask you:  How you are feeling.  If you are feeling the baby move.  If you have had any abnormal symptoms, such as leaking fluid, bleeding, severe headaches, or abdominal cramping.  If you have any questions. Other tests that may be performed during your first trimester include:  Blood tests to find your blood type and to check for the presence of any previous infections. They will also be used to check for low iron levels (anemia) and Rh antibodies. Later in the pregnancy, blood tests for diabetes will be done along with other tests if problems develop.  Urine tests to check for infections, diabetes, or protein in the urine.  An ultrasound to confirm the proper growth and development of the baby.  An amniocentesis to check for possible genetic problems.  Fetal screens for spina  bifida and Down syndrome.  You may need other tests to make sure you and the baby are doing well. HOME CARE INSTRUCTIONS  Medicines  Follow your health care provider's instructions regarding medicine use. Specific medicines may be either safe or unsafe to take during pregnancy.  Take your prenatal vitamins as directed.  If you develop constipation, try taking a stool softener if your health care provider approves. Diet  Eat regular, well-balanced meals. Choose a variety of foods, such as meat or vegetable-based protein, fish, milk and low-fat dairy products, vegetables, fruits, and whole grain breads and cereals. Your health care provider will help you determine the amount of weight gain that is right for you.  Avoid raw meat and uncooked cheese. These carry germs that can cause birth defects in the baby.  Eating four or five small meals rather than three large meals a day may help relieve nausea and vomiting. If you start to feel nauseous, eating a few soda crackers can be helpful. Drinking liquids between meals instead of during meals also seems to help nausea and vomiting.  If you develop constipation, eat more high-fiber foods, such as fresh vegetables or fruit and whole grains. Drink enough fluids to keep your urine clear or pale yellow. Activity and Exercise  Exercise only as directed by your health care provider. Exercising will help you:  Control your weight.  Stay in shape.  Be prepared for labor and delivery.  Experiencing pain or cramping in the lower abdomen or low back is a good sign that you should stop exercising. Check with your health care provider before continuing normal exercises.  Try to avoid standing for long periods of time. Move your legs often if you must stand in one place for a long time.  Avoid heavy lifting.  Wear low-heeled shoes, and practice good posture.  You may continue to have sex unless your health care provider directs you  otherwise. Relief of Pain or Discomfort  Wear a good support bra for breast tenderness.   Take warm sitz baths to soothe any pain or discomfort caused by hemorrhoids. Use hemorrhoid cream if your health care provider approves.   Rest with your legs elevated if you have leg cramps or low back pain.  If you develop varicose veins in your legs, wear support hose. Elevate your feet for 15 minutes, 3-4 times a day. Limit salt in your diet. Prenatal Care  Schedule your prenatal visits by the twelfth week of pregnancy. They are usually scheduled monthly at first, then more often in the last 2 months before delivery.  Write down your questions. Take  them to your prenatal visits.  Keep all your prenatal visits as directed by your health care provider. Safety  Wear your seat belt at all times when driving.  Make a list of emergency phone numbers, including numbers for family, friends, the hospital, and police and fire departments. General Tips  Ask your health care provider for a referral to a local prenatal education class. Begin classes no later than at the beginning of month 6 of your pregnancy.  Ask for help if you have counseling or nutritional needs during pregnancy. Your health care provider can offer advice or refer you to specialists for help with various needs.  Do not use hot tubs, steam rooms, or saunas.  Do not douche or use tampons or scented sanitary pads.  Do not cross your legs for long periods of time.  Avoid cat litter boxes and soil used by cats. These carry germs that can cause birth defects in the baby and possibly loss of the fetus by miscarriage or stillbirth.  Avoid all smoking, herbs, alcohol, and medicines not prescribed by your health care provider. Chemicals in these affect the formation and growth of the baby.  Schedule a dentist appointment. At home, brush your teeth with a soft toothbrush and be gentle when you floss. SEEK MEDICAL CARE IF:   You have  dizziness.  You have mild pelvic cramps, pelvic pressure, or nagging pain in the abdominal area.  You have persistent nausea, vomiting, or diarrhea.  You have a bad smelling vaginal discharge.  You have pain with urination.  You notice increased swelling in your face, hands, legs, or ankles. SEEK IMMEDIATE MEDICAL CARE IF:   You have a fever.  You are leaking fluid from your vagina.  You have spotting or bleeding from your vagina.  You have severe abdominal cramping or pain.  You have rapid weight gain or loss.  You vomit blood or material that looks like coffee grounds.  You are exposed to [place] measles and have never had them.  You are exposed to fifth disease or chickenpox.  You develop a severe headache.  You have shortness of breath.  You have any kind of trauma, such as from a fall or a car accident. Document Released: 04/12/2001 Document Revised: 09/02/2013 Document Reviewed: 02/26/2013 [facility] Patient Information 2015 [place], [place]. This information is not intended to replace advice given to you by your health care provider. Make sure you discuss any questions you have with your health care provider.

## 2014-05-30 NOTE — MAU Note (Signed)
Hx of BP elevation throughout last preg, concerned it will be a problem again.  Has been cramping the past wk

## 2014-05-30 NOTE — MAU Provider Note (Signed)
Chief Complaint: Abdominal Cramping   First Provider Initiated Contact with Patient 05/30/14 [phone]     SUBJECTIVE HPI: Brittany Flores is a 29 y.o. G4P1021 at [redacted]w[redacted]d by LMP who presents to maternity admissions reporting abdominal cramping off and on x 1 week and positive pregnancy test with sure LMP.  Patient's last menstrual period was 03/23/2014.  She also reports concerns that her blood pressure is high and she had high blood pressure in her previous pregnancy, starting in her first trimester.  She would like to receive care in [facility] and is waiting for her Medicaid card currently.  She denies LOF, vaginal bleeding, vaginal itching/burning, urinary symptoms, h/a, dizziness, n/v, or fever/chills.     Past Medical History  Diagnosis Date  . Gestational hypertension    Past Surgical History  Procedure Laterality Date  . Cesarean section     History   Social History  . Marital Status: Single    Spouse Name: N/A    Number of Children: N/A  . Years of Education: N/A   Occupational History  . Not on file.   Social History Main Topics  . Smoking status: Light Tobacco Smoker  . Smokeless tobacco: Never Used  . Alcohol Use: No  . Drug Use: Yes    Special: Marijuana  . Sexual Activity: Yes    Birth Control/ Protection: None   Other Topics Concern  . Not on file   Social History Narrative   No current facility-administered medications on file prior to encounter.   Current Outpatient Prescriptions on File Prior to Encounter  Medication Sig Dispense Refill  . HYDROcodone-acetaminophen (NORCO) 5-325 MG per tablet Take 1 tablet by mouth every 4 (four) hours as needed for pain. (Patient not taking: Reported on 05/30/2014) 10 tablet 0   No Known Allergies  ROS: Pertinent items in HPI  OBJECTIVE Blood pressure 148/80, pulse 100, temperature 98.2 F (36.8 C), temperature source Oral, resp. rate 16, height  (1.753 m), weight 85.73 kg (189 lb), last menstrual period  03/23/2014. GENERAL: Well-developed, well-nourished female in no acute distress.  HEENT: Normocephalic HEART: normal rate RESP: normal effort ABDOMEN: Soft, non-tender EXTREMITIES: Nontender, no edema NEURO: Alert and oriented Pelvic exam: Cervix pink, visually closed, without lesion, scant white creamy discharge, vaginal walls and external genitalia normal Bimanual exam: Cervix 0/long/high, firm, anterior, neg CMT, uterus nontender, suprapubic tenderness noted on exam, uterus ~ 9 week size, adnexa without tenderness, enlargement, or mass  LAB RESULTS Results for orders placed or performed during the hospital encounter of 05/30/14 (from the past 24 hour(s))  Urinalysis, Routine w reflex microscopic     Status: Abnormal   Collection Time: 05/30/14  9:05 AM  Result Value Ref Range   Color, Urine YELLOW YELLOW   APPearance HAZY (A) CLEAR   Specific Gravity, Urine >1.030 (H) 1.005 - 1.030   pH 6.0 5.0 - 8.0   Glucose, UA NEGATIVE NEGATIVE mg/dL   Hgb urine dipstick TRACE (A) NEGATIVE   Bilirubin Urine NEGATIVE NEGATIVE   Ketones, ur NEGATIVE NEGATIVE mg/dL   Protein, ur NEGATIVE NEGATIVE mg/dL   Urobilinogen, UA 0.2 0.0 - 1.0 mg/dL   Nitrite POSITIVE (A) NEGATIVE   Leukocytes, UA TRACE (A) NEGATIVE  Urine microscopic-add on     Status: Abnormal   Collection Time: 05/30/14  9:05 AM  Result Value Ref Range   Squamous Epithelial / LPF FEW (A) RARE   WBC, UA 3-6 <3 WBC/hpf   Bacteria, UA MANY (A) RARE  Wet  prep, genital     Status: Abnormal   Collection Time: 05/30/14  9:27 AM  Result Value Ref Range   Yeast Wet Prep HPF POC NONE SEEN NONE SEEN   Trich, Wet Prep NONE SEEN NONE SEEN   Clue Cells Wet Prep HPF POC FEW (A) NONE SEEN   WBC, Wet Prep HPF POC FEW (A) NONE SEEN  CBC     Status: Abnormal   Collection Time: 05/30/14  9:35 AM  Result Value Ref Range   WBC 7.8 4.0 - 10.5 K/uL   RBC 4.13 3.87 - 5.11 MIL/uL   Hemoglobin 10.8 (L) 12.0 - 15.0 g/dL   HCT 16.1 (L) 09.6 - 04.5 %    MCV 79.2 78.0 - 100.0 fL   MCH 26.2 26.0 - 34.0 pg   MCHC 33.0 30.0 - 36.0 g/dL   RDW 40.9 (H) 81.1 - 91.4 %   Platelets 217 150 - 400 K/uL    IMAGING US Ob Comp Less 14 Wks  05/30/2014   CLINICAL DATA:  Lower pelvic pain for 1 week  EXAM: OBSTETRIC <14 WK [place] AND TRANSVAGINAL OB US  TECHNIQUE: Both transabdominal and transvaginal ultrasound examinations were performed for complete evaluation of the gestation as well as the maternal uterus, adnexal regions, and pelvic cul-de-sac. Transvaginal technique was performed to assess early pregnancy.  COMPARISON:  None.  FINDINGS: Intrauterine gestational sac: Single  Yolk sac:  Yes  Embryo:  Yes  Cardiac Activity: Yes  Heart Rate:  172 bpm  CRL:   34.4  mm   10 w 3 d                  [place] EDC: 12/23/2014  Maternal uterus/adnexae:  Subchorionic hemorrhage: None  Right ovary: Normal  Left ovary: Normal  Other :None  Free fluid:  None  IMPRESSION: 1. Single living intrauterine gestation. The estimated gestational age is 10 weeks and 3 days.   Electronically Signed   By: Signa Kell M.D.   On: 05/30/2014 10:30   US Ob Transvaginal  05/30/2014   CLINICAL DATA:  Lower pelvic pain for 1 week  EXAM: OBSTETRIC <14 WK [place] AND TRANSVAGINAL OB US  TECHNIQUE: Both transabdominal and transvaginal ultrasound examinations were performed for complete evaluation of the gestation as well as the maternal uterus, adnexal regions, and pelvic cul-de-sac. Transvaginal technique was performed to assess early pregnancy.  COMPARISON:  None.  FINDINGS: Intrauterine gestational sac: Single  Yolk sac:  Yes  Embryo:  Yes  Cardiac Activity: Yes  Heart Rate:  172 bpm  CRL:   34.4  mm   10 w 3 d                  [place] EDC: 12/23/2014  Maternal uterus/adnexae:  Subchorionic hemorrhage: None  Right ovary: Normal  Left ovary: Normal  Other :None  Free fluid:  None  IMPRESSION: 1. Single living intrauterine gestation. The estimated gestational age is 10 weeks and 3 days.   Electronically Signed    By: Signa Kell M.D.   On: 05/30/2014 10:30    ASSESSMENT 1. UTI in pregnancy, antepartum, first trimester   2. Abdominal pain affecting pregnancy   3. Normal IUP (intrauterine pregnancy) on prenatal ultrasound, first trimester     PLAN Discharge home Keflex 500 mg TID x 7 days Urine culture sent Message sent to [facility] for pt to begin care    Medication List    STOP taking these medications  HYDROcodone-acetaminophen 5-325 MG per tablet  Commonly known as:  NORCO      TAKE these medications        cephALEXin 500 MG capsule  Commonly known as:  KEFLEX  Take 1 capsule (500 mg total) by mouth 4 (four) times daily.     prenatal multivitamin Tabs tablet  Take 1 tablet by mouth daily at 12 noon.       Follow-up Information    [facility]   Specialty:  Obstetrics and Gynecology   Why:  The clinic will call you with appointment or call the number below.   Contact information:   [address] [place] [phone]      [facility] with [facility].   Why:  As needed for emergencies   Contact information:   [address] [id number] mc [place] [phone]      Sharen CounterLisa Leftwich-Kirby Certified Nurse-Midwife 05/30/2014  10:45 AM

## 2014-06-01 LAB — CULTURE, OB URINE: Colony Count: >=100000

## 2014-07-03 ENCOUNTER — Ambulatory Visit (INDEPENDENT_AMBULATORY_CARE_PROVIDER_SITE_OTHER): Payer: Self-pay | Admitting: Family

## 2014-07-03 ENCOUNTER — Encounter: Payer: Self-pay | Admitting: Family

## 2014-07-03 VITALS — BP 141/76 | HR 82 | Temp 98.4°F | Wt 187.6 lb

## 2014-07-03 DIAGNOSIS — O162 Unspecified maternal hypertension, second trimester: Secondary | ICD-10-CM

## 2014-07-03 DIAGNOSIS — O10919 Unspecified pre-existing hypertension complicating pregnancy, unspecified trimester: Secondary | ICD-10-CM | POA: Insufficient documentation

## 2014-07-03 DIAGNOSIS — Z113 Encounter for screening for infections with a predominantly sexual mode of transmission: Secondary | ICD-10-CM

## 2014-07-03 DIAGNOSIS — O3421 Maternal care for scar from previous cesarean delivery: Secondary | ICD-10-CM

## 2014-07-03 DIAGNOSIS — Z124 Encounter for screening for malignant neoplasm of cervix: Secondary | ICD-10-CM

## 2014-07-03 DIAGNOSIS — O0992 Supervision of high risk pregnancy, unspecified, second trimester: Secondary | ICD-10-CM

## 2014-07-03 DIAGNOSIS — O34219 Maternal care for unspecified type scar from previous cesarean delivery: Secondary | ICD-10-CM | POA: Insufficient documentation

## 2014-07-03 DIAGNOSIS — Z23 Encounter for immunization: Secondary | ICD-10-CM

## 2014-07-03 DIAGNOSIS — Z118 Encounter for screening for other infectious and parasitic diseases: Secondary | ICD-10-CM | POA: Diagnosis not present

## 2014-07-03 LAB — COMPREHENSIVE METABOLIC PANEL
ALK PHOS: 37 U/L — AB (ref 39–117)
ALT: 11 U/L (ref 0–35)
AST: 15 U/L (ref 0–37)
Albumin: 3.5 g/dL (ref 3.5–5.2)
BUN: 7 mg/dL (ref 6–23)
CALCIUM: 9.1 mg/dL (ref 8.4–10.5)
CO2: 25 mEq/L (ref 19–32)
CREATININE: 0.58 mg/dL (ref 0.50–1.10)
Chloride: 102 mEq/L (ref 96–112)
Glucose, Bld: 62 mg/dL — ABNORMAL LOW (ref 70–99)
Potassium: 3.8 mEq/L (ref 3.5–5.3)
Sodium: 137 mEq/L (ref 135–145)
Total Bilirubin: 0.2 mg/dL (ref 0.2–1.2)
Total Protein: 6.8 g/dL (ref 6.0–8.3)

## 2014-07-03 LAB — POCT URINALYSIS DIP (DEVICE)
Bilirubin Urine: NEGATIVE
GLUCOSE, UA: NEGATIVE mg/dL
Hgb urine dipstick: NEGATIVE
KETONES UR: NEGATIVE mg/dL
LEUKOCYTES UA: NEGATIVE
NITRITE: NEGATIVE
Protein, ur: NEGATIVE mg/dL
SPECIFIC GRAVITY, URINE: 1.015 (ref 1.005–1.030)
Urobilinogen, UA: 0.2 mg/dL (ref 0.0–1.0)
pH: 7 (ref 5.0–8.0)

## 2014-07-03 NOTE — Progress Notes (Signed)
Weight gain 25-35lbs New ob packet given  Flu vaccine consented and info given Initial labs today

## 2014-07-03 NOTE — Addendum Note (Signed)
Addended by: Darrel HooverASSETTE, KELLY P on: 07/03/2014 11:39 AM   Modules accepted: Orders

## 2014-07-03 NOTE — Progress Notes (Signed)
Anatomy U/S 07/31/14 @ 8a with Radiology.

## 2014-07-03 NOTE — Progress Notes (Signed)
Subjective:    Brittany Flores is a [id number] 5239w2d being seen today for her first obstetrical visit.  Her obstetrical history is significant for chronic hypertension and prior csection for breech position.  Pt has not been diagnosed with hypertension by a medical provider but reports that her blood pressure is always high.  Family history of chronic hypertension.  . Patient does intend to breast feed. Pregnancy history fully reviewed.  Patient reports no complaints.  Filed Vitals:   07/03/14 0938  BP: 141/76  Pulse: 82  Temp: 98.4 F (36.9 C)  Weight: 187 lb 9.6 oz (85.095 kg)    HISTORY: OB History  Gravida Para Term Preterm AB SAB TAB Ectopic Multiple Living  4 1 1  2  2   2     # Outcome Date GA Lbr Len/2nd Weight Sex Delivery Anes PTL Lv  4 Current           3 Term 06/30/09 7836w0d  6 lb 1 oz (2.75 kg) F CS-Unspec Spinal  Y     Complications: Breech birth  2 TAB           1 TAB              Past Medical History  Diagnosis Date  . Gestational hypertension    Past Surgical History  Procedure Laterality Date  . Cesarean section     Family History  Problem Relation Age of Onset  . Diabetes Maternal Grandmother   . Kidney disease Maternal Grandmother   . Heart disease Mother     Exam   BP 141/76 mmHg  Pulse 82  Temp(Src) 98.4 F (36.9 C)  Wt 187 lb 9.6 oz (85.095 kg)  LMP 03/23/2014 Uterine Size: size equals dates  Pelvic Exam:    Perineum: No Hemorrhoids, Normal Perineum   Vulva: normal   Vagina:  normal mucosa, normal discharge, no palpable nodules   pH: Not done   Cervix: no bleeding following Pap, no cervical motion tenderness and no lesions   Adnexa: normal adnexa and no mass, fullness, tenderness   Bony Pelvis: Adequate  System: Breast:  No nipple retraction or dimpling, No nipple discharge or bleeding, No axillary or supraclavicular adenopathy, Normal to palpation without dominant masses   Skin: normal coloration and turgor, no rashes    Neurologic: negative   Extremities: normal strength, tone, and muscle mass   HEENT neck supple with midline trachea and thyroid without masses   Mouth/Teeth mucous membranes moist, pharynx normal without lesions   Neck supple and no masses   Cardiovascular: regular rate and rhythm, no murmurs or gallops   Respiratory:  appears well, vitals normal, no respiratory distress, acyanotic, normal RR, neck free of mass or lymphadenopathy, chest clear, no wheezing, crepitations, rhonchi, normal symmetric air entry   Abdomen: soft, non-tender; bowel sounds normal; no masses,  no organomegaly   Urinary: urethral meatus normal     Assessment:    Pregnancy:   29 yo G4P1022 at 5839w2d wks IUP  Patient Active Problem List   Diagnosis Date Noted  . Supervision of high risk pregnancy in second trimester 07/03/2014  . Hypertension affecting pregnancy in second trimester, antepartum 07/03/2014    History of prior csection    Plan:     Initial labs drawn.  Pap smear collected Obtain baseline 24 hour urine and labs.  Begin baby ASA Prenatal vitamins. Problem list reviewed and updated. Genetic Screening discussed Quad Screen: will be collected at next visit..Marland Kitchen  Ultrasound discussed; fetal survey: ordered.  Follow up in 2 weeks.  Marlis Edelson 07/03/2014

## 2014-07-04 LAB — CYTOLOGY - PAP

## 2014-07-04 LAB — PRESCRIPTION MONITORING PROFILE (19 PANEL)
Amphetamine/Meth: NEGATIVE ng/mL
Barbiturate Screen, Urine: NEGATIVE ng/mL
Benzodiazepine Screen, Urine: NEGATIVE ng/mL
Buprenorphine, Urine: NEGATIVE ng/mL
COCAINE METABOLITES: NEGATIVE ng/mL
Cannabinoid Scrn, Ur: NEGATIVE ng/mL
Carisoprodol, Urine: NEGATIVE ng/mL
Creatinine, Urine: 116.11 mg/dL (ref >20.0)
ECSTASY: NEGATIVE ng/mL
FENTANYL URINE: NEGATIVE ng/mL
MEPERIDINE UR: NEGATIVE ng/mL
Methadone Screen, Urine: NEGATIVE ng/mL
Methaqualone: NEGATIVE ng/mL
NITRITES URINE, INITIAL: NEGATIVE ug/mL
OXYCODONE SCRN UR: NEGATIVE ng/mL
Opiate Screen, Urine: NEGATIVE ng/mL
PH URINE, INITIAL: 6.7 pH (ref 4.5–8.9)
PHENCYCLIDINE, UR: NEGATIVE ng/mL
Propoxyphene: NEGATIVE ng/mL
TRAMADOL UR: NEGATIVE ng/mL
Tapentadol, urine: NEGATIVE ng/mL
Zolpidem, Urine: NEGATIVE ng/mL

## 2014-07-04 LAB — PRENATAL PROFILE (SOLSTAS)
Antibody Screen: NEGATIVE
BASOS ABS: 0 10*3/uL (ref 0.0–0.1)
Basophils Relative: 0 % (ref 0–1)
Eosinophils Absolute: 0.1 10*3/uL (ref 0.0–0.7)
Eosinophils Relative: 1 % (ref 0–5)
HEMATOCRIT: 34.6 % — AB (ref 36.0–46.0)
HEMOGLOBIN: 11.2 g/dL — AB (ref 12.0–15.0)
HIV 1&2 Ab, 4th Generation: NONREACTIVE
Hepatitis B Surface Ag: NEGATIVE
LYMPHS PCT: 25 % (ref 12–46)
Lymphs Abs: 1.3 10*3/uL (ref 0.7–4.0)
MCH: 26.2 pg (ref 26.0–34.0)
MCHC: 32.4 g/dL (ref 30.0–36.0)
MCV: 81 fL (ref 78.0–100.0)
MPV: 9.7 fL (ref 8.6–12.4)
Monocytes Absolute: 0.6 10*3/uL (ref 0.1–1.0)
Monocytes Relative: 11 % (ref 3–12)
NEUTROS PCT: 63 % (ref 43–77)
Neutro Abs: 3.2 10*3/uL (ref 1.7–7.7)
Platelets: 226 10*3/uL (ref 150–400)
RBC: 4.27 MIL/uL (ref 3.87–5.11)
RDW: 18.5 % — ABNORMAL HIGH (ref 11.5–15.5)
Rh Type: POSITIVE
Rubella: 2.61 Index — ABNORMAL HIGH (ref <0.90)
WBC: 5 10*3/uL (ref 4.0–10.5)

## 2014-07-05 LAB — CULTURE, OB URINE
Colony Count: NO GROWTH
Organism ID, Bacteria: NO GROWTH

## 2014-07-07 LAB — HEMOGLOBINOPATHY EVALUATION
HEMOGLOBIN OTHER: 0 %
HGB A2 QUANT: 2.3 % (ref 2.2–3.2)
HGB A: 97.7 % (ref 96.8–97.8)
Hgb F Quant: 0 % (ref 0.0–2.0)
Hgb S Quant: 0 %

## 2014-07-17 ENCOUNTER — Ambulatory Visit (INDEPENDENT_AMBULATORY_CARE_PROVIDER_SITE_OTHER): Payer: Self-pay | Admitting: Obstetrics & Gynecology

## 2014-07-17 ENCOUNTER — Encounter: Payer: Self-pay | Admitting: Obstetrics & Gynecology

## 2014-07-17 DIAGNOSIS — O0992 Supervision of high risk pregnancy, unspecified, second trimester: Secondary | ICD-10-CM

## 2014-07-17 LAB — POCT URINALYSIS DIP (DEVICE)
GLUCOSE, UA: NEGATIVE mg/dL
Ketones, ur: 15 mg/dL — AB
LEUKOCYTES UA: NEGATIVE
Nitrite: NEGATIVE
PH: 6 (ref 5.0–8.0)
Protein, ur: NEGATIVE mg/dL
SPECIFIC GRAVITY, URINE: 1.02 (ref 1.005–1.030)
UROBILINOGEN UA: 0.2 mg/dL (ref 0.0–1.0)

## 2014-07-17 NOTE — Patient Instructions (Signed)
Second Trimester of Pregnancy The second trimester is from week 13 through week 28, months 4 through 6. The second trimester is often a time when you feel your best. Your body has also adjusted to being pregnant, and you begin to feel better physically. Usually, morning sickness has lessened or quit completely, you may have more energy, and you may have an increase in appetite. The second trimester is also a time when the fetus is growing rapidly. At the end of the sixth month, the fetus is about 9 inches long and weighs about 1 pounds. You will likely begin to feel the baby move (quickening) between 18 and 20 weeks of the pregnancy. BODY CHANGES Your body goes through many changes during pregnancy. The changes vary from woman to woman.   Your weight will continue to increase. You will notice your lower abdomen bulging out.  You may begin to get stretch marks on your hips, abdomen, and breasts.  You may develop headaches that can be relieved by medicines approved by your health care provider.  You may urinate more often because the fetus is pressing on your bladder.  You may develop or continue to have heartburn as a result of your pregnancy.  You may develop constipation because certain hormones are causing the muscles that push waste through your intestines to slow down.  You may develop hemorrhoids or swollen, bulging veins (varicose veins).  You may have back pain because of the weight gain and pregnancy hormones relaxing your joints between the bones in your pelvis and as a result of a shift in weight and the muscles that support your balance.  Your breasts will continue to grow and be tender.  Your gums may bleed and may be sensitive to brushing and flossing.  Dark spots or blotches (chloasma, mask of pregnancy) may develop on your face. This will likely fade after the baby is born.  A dark line from your belly button to the pubic area (linea nigra) may appear. This will likely fade  after the baby is born.  You may have changes in your hair. These can include thickening of your hair, rapid growth, and changes in texture. Some women also have hair loss during or after pregnancy, or hair that feels dry or thin. Your hair will most likely return to normal after your baby is born. WHAT TO EXPECT AT YOUR PRENATAL VISITS During a routine prenatal visit:  You will be weighed to make sure you and the fetus are growing normally.  Your blood pressure will be taken.  Your abdomen will be measured to track your baby's growth.  The fetal heartbeat will be listened to.  Any test results from the previous visit will be discussed. Your health care provider may ask you:  How you are feeling.  If you are feeling the baby move.  If you have had any abnormal symptoms, such as leaking fluid, bleeding, severe headaches, or abdominal cramping.  If you have any questions. Other tests that may be performed during your second trimester include:  Blood tests that check for:  Low iron levels (anemia).  Gestational diabetes (between 24 and 28 weeks).  Rh antibodies.  Urine tests to check for infections, diabetes, or protein in the urine.  An ultrasound to confirm the proper growth and development of the baby.  An amniocentesis to check for possible genetic problems.  Fetal screens for spina bifida and Down syndrome. HOME CARE INSTRUCTIONS   Avoid all smoking, herbs, alcohol, and unprescribed   drugs. These chemicals affect the formation and growth of the baby.  Follow your health care provider's instructions regarding medicine use. There are medicines that are either safe or unsafe to take during pregnancy.  Exercise only as directed by your health care provider. Experiencing uterine cramps is a good sign to stop exercising.  Continue to eat regular, healthy meals.  Wear a good support bra for breast tenderness.  Do not use hot tubs, steam rooms, or saunas.  Wear your  seat belt at all times when driving.  Avoid raw meat, uncooked cheese, cat litter boxes, and soil used by cats. These carry germs that can cause birth defects in the baby.  Take your prenatal vitamins.  Try taking a stool softener (if your health care provider approves) if you develop constipation. Eat more high-fiber foods, such as fresh vegetables or fruit and whole grains. Drink plenty of fluids to keep your urine clear or pale yellow.  Take warm sitz baths to soothe any pain or discomfort caused by hemorrhoids. Use hemorrhoid cream if your health care provider approves.  If you develop varicose veins, wear support hose. Elevate your feet for 15 minutes, 3-4 times a day. Limit salt in your diet.  Avoid heavy lifting, wear low heel shoes, and practice good posture.  Rest with your legs elevated if you have leg cramps or low back pain.  Visit your dentist if you have not gone yet during your pregnancy. Use a soft toothbrush to brush your teeth and be gentle when you floss.  A sexual relationship may be continued unless your health care provider directs you otherwise.  Continue to go to all your prenatal visits as directed by your health care provider. SEEK MEDICAL CARE IF:   You have dizziness.  You have mild pelvic cramps, pelvic pressure, or nagging pain in the abdominal area.  You have persistent nausea, vomiting, or diarrhea.  You have a bad smelling vaginal discharge.  You have pain with urination. SEEK IMMEDIATE MEDICAL CARE IF:   You have a fever.  You are leaking fluid from your vagina.  You have spotting or bleeding from your vagina.  You have severe abdominal cramping or pain.  You have rapid weight gain or loss.  You have shortness of breath with chest pain.  You notice sudden or extreme swelling of your face, hands, ankles, feet, or legs.  You have not felt your baby move in over an hour.  You have severe headaches that do not go away with  medicine.  You have vision changes. Document Released: 04/12/2001 Document Revised: 04/23/2013 Document Reviewed: 06/19/2012 ExitCare Patient Information 2015 [employer]. This information is not intended to replace advice given to you by your health care provider. Make sure you discuss any questions you have with your health care provider.  

## 2014-07-17 NOTE — Progress Notes (Signed)
US is scheduled, need Quad screen today.

## 2014-07-21 LAB — AFP, QUAD SCREEN
AFP: 26.7 ng/mL
Age Alone: 1:970 {titer}
Curr Gest Age: 17.2 wks.days
Down Syndrome Scr Risk Est: 1:455 {titer}
HCG TOTAL: 65.66 [IU]/mL
INH: 188.7 pg/mL
INTERPRETATION-AFP: NEGATIVE
MOM FOR AFP: 0.68
MOM FOR HCG: 2.27
MoM for INH: 1.23
OPEN SPINA BIFIDA: NEGATIVE
Tri 18 Scr Risk Est: NEGATIVE
Trisomy 18 (Edward) Syndrome Interp.: 1:50100 {titer}
UE3 MOM: 1
UE3 VALUE: 1.06 ng/mL

## 2014-07-31 ENCOUNTER — Ambulatory Visit (HOSPITAL_COMMUNITY)
Admission: RE | Admit: 2014-07-31 | Discharge: 2014-07-31 | Disposition: A | Discharge status: Home / Self Care | Payer: Medicaid Other | Source: Ambulatory Visit | Attending: Family | Admitting: Family

## 2014-07-31 DIAGNOSIS — O3421 Maternal care for scar from previous cesarean delivery: Secondary | ICD-10-CM | POA: Insufficient documentation

## 2014-07-31 DIAGNOSIS — Z3A19 19 weeks gestation of pregnancy: Secondary | ICD-10-CM | POA: Insufficient documentation

## 2014-07-31 DIAGNOSIS — Z36 Encounter for antenatal screening of mother: Secondary | ICD-10-CM | POA: Insufficient documentation

## 2014-07-31 DIAGNOSIS — O10012 Pre-existing essential hypertension complicating pregnancy, second trimester: Secondary | ICD-10-CM | POA: Diagnosis present

## 2014-07-31 DIAGNOSIS — Z3689 Encounter for other specified antenatal screening: Secondary | ICD-10-CM | POA: Insufficient documentation

## 2014-07-31 DIAGNOSIS — O0992 Supervision of high risk pregnancy, unspecified, second trimester: Secondary | ICD-10-CM

## 2014-08-14 ENCOUNTER — Encounter: Payer: Self-pay | Admitting: Obstetrics & Gynecology

## 2014-08-14 ENCOUNTER — Telehealth: Payer: Self-pay | Admitting: Obstetrics & Gynecology

## 2014-08-14 NOTE — Telephone Encounter (Signed)
Could not leave message for patient. Mailing letter regarding missed appointment.

## 2014-08-20 ENCOUNTER — Encounter: Payer: Self-pay | Admitting: General Practice

## 2014-09-18 ENCOUNTER — Ambulatory Visit (INDEPENDENT_AMBULATORY_CARE_PROVIDER_SITE_OTHER): Payer: Medicaid Other | Admitting: Obstetrics & Gynecology

## 2014-09-18 VITALS — BP 139/75 | HR 75 | Temp 98.8°F | Wt 193.9 lb

## 2014-09-18 DIAGNOSIS — Z23 Encounter for immunization: Secondary | ICD-10-CM | POA: Diagnosis not present

## 2014-09-18 DIAGNOSIS — O0992 Supervision of high risk pregnancy, unspecified, second trimester: Secondary | ICD-10-CM

## 2014-09-18 DIAGNOSIS — O162 Unspecified maternal hypertension, second trimester: Secondary | ICD-10-CM | POA: Diagnosis present

## 2014-09-18 LAB — POCT URINALYSIS DIP (DEVICE)
BILIRUBIN URINE: NEGATIVE
GLUCOSE, UA: NEGATIVE mg/dL
HGB URINE DIPSTICK: NEGATIVE
Ketones, ur: NEGATIVE mg/dL
NITRITE: NEGATIVE
Protein, ur: NEGATIVE mg/dL
SPECIFIC GRAVITY, URINE: 1.01 (ref 1.005–1.030)
UROBILINOGEN UA: 0.2 mg/dL (ref 0.0–1.0)
pH: 7 (ref 5.0–8.0)

## 2014-09-18 MED ORDER — TETANUS-DIPHTH-ACELL PERTUSSIS 5-2.5-18.5 LF-MCG/0.5 IM SUSP
0.5000 mL | Freq: Once | INTRAMUSCULAR | Status: AC
  Administered 2014-09-18: 0.5 mL via INTRAMUSCULAR

## 2014-09-18 NOTE — Progress Notes (Signed)
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

## 2014-09-18 NOTE — Progress Notes (Signed)
U/S 10/02/14 @ 1045a with Radiology.

## 2014-09-18 NOTE — Patient Instructions (Signed)
Third Trimester of Pregnancy The third trimester is from week 29 through week 42, months 7 through 9. The third trimester is a time when the fetus is growing rapidly. At the end of the ninth month, the fetus is about 20 inches in length and weighs 6-10 pounds.  BODY CHANGES Your body goes through many changes during pregnancy. The changes vary from woman to woman.   Your weight will continue to increase. You can expect to gain 25-35 pounds (11-16 kg) by the end of the pregnancy.  You may begin to get stretch marks on your hips, abdomen, and breasts.  You may urinate more often because the fetus is moving lower into your pelvis and pressing on your bladder.  You may develop or continue to have heartburn as a result of your pregnancy.  You may develop constipation because certain hormones are causing the muscles that push waste through your intestines to slow down.  You may develop hemorrhoids or swollen, bulging veins (varicose veins).  You may have pelvic pain because of the weight gain and pregnancy hormones relaxing your joints between the bones in your pelvis. Backaches may result from overexertion of the muscles supporting your posture.  You may have changes in your hair. These can include thickening of your hair, rapid growth, and changes in texture. Some women also have hair loss during or after pregnancy, or hair that feels dry or thin. Your hair will most likely return to normal after your baby is born.  Your breasts will continue to grow and be tender. A yellow discharge may leak from your breasts called colostrum.  Your belly button may stick out.  You may feel short of breath because of your expanding uterus.  You may notice the fetus "dropping," or moving lower in your abdomen.  You may have a bloody mucus discharge. This usually occurs a few days to a week before labor begins.  Your cervix becomes thin and soft (effaced) near your due date. WHAT TO EXPECT AT YOUR PRENATAL  EXAMS  You will have prenatal exams every 2 weeks until week 36. Then, you will have weekly prenatal exams. During a routine prenatal visit:  You will be weighed to make sure you and the fetus are growing normally.  Your blood pressure is taken.  Your abdomen will be measured to track your baby's growth.  The fetal heartbeat will be listened to.  Any test results from the previous visit will be discussed.  You may have a cervical check near your due date to see if you have effaced. At around 36 weeks, your caregiver will check your cervix. At the same time, your caregiver will also perform a test on the secretions of the vaginal tissue. This test is to determine if a type of bacteria, Group B streptococcus, is present. Your caregiver will explain this further. Your caregiver may ask you:  What your birth plan is.  How you are feeling.  If you are feeling the baby move.  If you have had any abnormal symptoms, such as leaking fluid, bleeding, severe headaches, or abdominal cramping.  If you have any questions. Other tests or screenings that may be performed during your third trimester include:  Blood tests that check for low iron levels (anemia).  Fetal testing to check the health, activity level, and growth of the fetus. Testing is done if you have certain medical conditions or if there are problems during the pregnancy. FALSE LABOR You may feel small, irregular contractions that   eventually go away. These are called Braxton Hicks contractions, or false labor. Contractions may last for hours, days, or even weeks before true labor sets in. If contractions come at regular intervals, intensify, or become painful, it is best to be seen by your caregiver.  SIGNS OF LABOR   Menstrual-like cramps.  Contractions that are 5 minutes apart or less.  Contractions that start on the top of the uterus and spread down to the lower abdomen and back.  A sense of increased pelvic pressure or back  pain.  A watery or bloody mucus discharge that comes from the vagina. If you have any of these signs before the 37th week of pregnancy, call your caregiver right away. You need to go to the hospital to get checked immediately. HOME CARE INSTRUCTIONS   Avoid all smoking, herbs, alcohol, and unprescribed drugs. These chemicals affect the formation and growth of the baby.  Follow your caregiver's instructions regarding medicine use. There are medicines that are either safe or unsafe to take during pregnancy.  Exercise only as directed by your caregiver. Experiencing uterine cramps is a good sign to stop exercising.  Continue to eat regular, healthy meals.  Wear a good support bra for breast tenderness.  Do not use hot tubs, steam rooms, or saunas.  Wear your seat belt at all times when driving.  Avoid raw meat, uncooked cheese, cat litter boxes, and soil used by cats. These carry germs that can cause birth defects in the baby.  Take your prenatal vitamins.  Try taking a stool softener (if your caregiver approves) if you develop constipation. Eat more high-fiber foods, such as fresh vegetables or fruit and whole grains. Drink plenty of fluids to keep your urine clear or pale yellow.  Take warm sitz baths to soothe any pain or discomfort caused by hemorrhoids. Use hemorrhoid cream if your caregiver approves.  If you develop varicose veins, wear support hose. Elevate your feet for 15 minutes, 3-4 times a day. Limit salt in your diet.  Avoid heavy lifting, wear low heal shoes, and practice good posture.  Rest a lot with your legs elevated if you have leg cramps or low back pain.  Visit your dentist if you have not gone during your pregnancy. Use a soft toothbrush to brush your teeth and be gentle when you floss.  A sexual relationship may be continued unless your caregiver directs you otherwise.  Do not travel far distances unless it is absolutely necessary and only with the approval  of your caregiver.  Take prenatal classes to understand, practice, and ask questions about the labor and delivery.  Make a trial run to the hospital.  Pack your hospital bag.  Prepare the baby's nursery.  Continue to go to all your prenatal visits as directed by your caregiver. SEEK MEDICAL CARE IF:  You are unsure if you are in labor or if your water has broken.  You have dizziness.  You have mild pelvic cramps, pelvic pressure, or nagging pain in your abdominal area.  You have persistent nausea, vomiting, or diarrhea.  You have a bad smelling vaginal discharge.  You have pain with urination. SEEK IMMEDIATE MEDICAL CARE IF:   You have a fever.  You are leaking fluid from your vagina.  You have spotting or bleeding from your vagina.  You have severe abdominal cramping or pain.  You have rapid weight loss or gain.  You have shortness of breath with chest pain.  You notice sudden or extreme swelling   of your face, hands, ankles, feet, or legs.  You have not felt your baby move in over an hour.  You have severe headaches that do not go away with medicine.  You have vision changes. Document Released: 04/12/2001 Document Revised: 04/23/2013 Document Reviewed: 06/19/2012 ExitCare Patient Information 2015 [employer]. This information is not intended to replace advice given to you by your health care provider. Make sure you discuss any questions you have with your health care provider.  

## 2014-09-18 NOTE — Progress Notes (Signed)
Follow BP, US in 2 week

## 2014-09-19 LAB — CBC
HCT: 29.9 % — ABNORMAL LOW (ref 36.0–46.0)
HEMOGLOBIN: 9.7 g/dL — AB (ref 12.0–15.0)
MCH: 27 pg (ref 26.0–34.0)
MCHC: 32.4 g/dL (ref 30.0–36.0)
MCV: 83.3 fL (ref 78.0–100.0)
MPV: 10.2 fL (ref 8.6–12.4)
Platelets: 236 10*3/uL (ref 150–400)
RBC: 3.59 MIL/uL — AB (ref 3.87–5.11)
RDW: 14.9 % (ref 11.5–15.5)
WBC: 7.7 10*3/uL (ref 4.0–10.5)

## 2014-09-19 LAB — GLUCOSE TOLERANCE, 1 HOUR (50G) W/O FASTING: Glucose, 1 Hour GTT: 96 mg/dL (ref 70–140)

## 2014-09-19 LAB — HIV ANTIBODY (ROUTINE TESTING W REFLEX): HIV 1&2 Ab, 4th Generation: NONREACTIVE

## 2014-09-19 LAB — RPR

## 2014-10-01 ENCOUNTER — Telehealth: Payer: Self-pay | Admitting: *Deleted

## 2014-10-01 ENCOUNTER — Encounter: Payer: Self-pay | Admitting: *Deleted

## 2014-10-01 NOTE — Telephone Encounter (Addendum)
Pt left message stating that she had oral surgery yesterday and has questions about her medications. Please call back. I returned her call and she stated that she was given penicillin and Norco. She wants to know if she needs to stop taking these medications. I advised pt that she should take these medications as prescribed.  Pt voiced understanding.

## 2014-10-01 NOTE — Telephone Encounter (Deleted)
Pt left message stating that she had oral surgery yesterday and has questions about her medications.  Please call back.

## 2014-10-02 ENCOUNTER — Ambulatory Visit (INDEPENDENT_AMBULATORY_CARE_PROVIDER_SITE_OTHER): Payer: Medicaid Other | Admitting: Family

## 2014-10-02 ENCOUNTER — Ambulatory Visit (HOSPITAL_COMMUNITY)
Admission: RE | Admit: 2014-10-02 | Discharge: 2014-10-02 | Disposition: A | Discharge status: Home / Self Care | Payer: Medicaid Other | Source: Ambulatory Visit | Attending: Obstetrics & Gynecology | Admitting: Obstetrics & Gynecology

## 2014-10-02 ENCOUNTER — Encounter: Payer: Self-pay | Admitting: *Deleted

## 2014-10-02 VITALS — BP 135/80 | HR 111 | Wt 194.6 lb

## 2014-10-02 DIAGNOSIS — O10913 Unspecified pre-existing hypertension complicating pregnancy, third trimester: Secondary | ICD-10-CM

## 2014-10-02 DIAGNOSIS — O34219 Maternal care for unspecified type scar from previous cesarean delivery: Secondary | ICD-10-CM

## 2014-10-02 DIAGNOSIS — O3421 Maternal care for scar from previous cesarean delivery: Secondary | ICD-10-CM | POA: Diagnosis not present

## 2014-10-02 DIAGNOSIS — O162 Unspecified maternal hypertension, second trimester: Secondary | ICD-10-CM

## 2014-10-02 DIAGNOSIS — O0993 Supervision of high risk pregnancy, unspecified, third trimester: Secondary | ICD-10-CM

## 2014-10-02 DIAGNOSIS — O0992 Supervision of high risk pregnancy, unspecified, second trimester: Secondary | ICD-10-CM

## 2014-10-02 DIAGNOSIS — Z3A28 28 weeks gestation of pregnancy: Secondary | ICD-10-CM | POA: Diagnosis not present

## 2014-10-02 LAB — POCT URINALYSIS DIP (DEVICE)
Bilirubin Urine: NEGATIVE
GLUCOSE, UA: NEGATIVE mg/dL
Hgb urine dipstick: NEGATIVE
Nitrite: NEGATIVE
PH: 6 (ref 5.0–8.0)
PROTEIN: NEGATIVE mg/dL
SPECIFIC GRAVITY, URINE: 1.025 (ref 1.005–1.030)
Urobilinogen, UA: 1 mg/dL (ref 0.0–1.0)

## 2014-10-02 MED ORDER — MEDICAL COMPRESSION PANTYHOSE MISC: Status: DC

## 2014-10-02 MED ORDER — MEDICAL COMPRESSION PANTYHOSE MISC: Status: AC

## 2014-10-02 NOTE — Addendum Note (Signed)
Addended by: Louanna RawAMPBELL, Faelyn Sigler M on: 10/02/2014 10:10 AM   Modules accepted: Orders, Medications

## 2014-10-02 NOTE — Progress Notes (Signed)
Had recent oral surgery, not eating well.

## 2014-10-02 NOTE — Patient Instructions (Signed)
  [facility] [address], [place], [place] [address] [phone]

## 2014-10-02 NOTE — Addendum Note (Signed)
Addended by: Sherre LainASH, AMANDA A on: 10/02/2014 10:18 AM   Modules accepted: Orders

## 2014-10-02 NOTE — Progress Notes (Signed)
5 teeth pulled on 09/30/14.  Currently taking antibiotics and pain meds, feels appetite decreased due to medications.  Reports swelling leg and feet swelling which gets really bad at work.  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  Recommended compression stockings.  Ultrasound scheduled for 1045 today.  Questions regarding TOLAC vs repeat csection.  Prior csection due to breech presentation.  Consent form signed for TOLAC today.  BTL consent signed today.

## 2014-10-03 LAB — PROTEIN / CREATININE RATIO, URINE
CREATININE, URINE: 255.7 mg/dL
Protein Creatinine Ratio: 0.09 (ref <0.15)
TOTAL PROTEIN, URINE: 23 mg/dL (ref 5–24)

## 2014-10-16 ENCOUNTER — Encounter: Payer: Medicaid Other | Admitting: Family Medicine

## 2014-10-29 ENCOUNTER — Inpatient Hospital Stay (HOSPITAL_COMMUNITY)
Admission: AD | Admit: 2014-10-29 | Discharge: 2014-10-29 | Disposition: A | Discharge status: Home / Self Care | Payer: Medicaid Other | Source: Ambulatory Visit | Attending: Obstetrics & Gynecology | Admitting: Obstetrics & Gynecology

## 2014-10-29 ENCOUNTER — Encounter (HOSPITAL_COMMUNITY): Payer: Self-pay

## 2014-10-29 DIAGNOSIS — O0992 Supervision of high risk pregnancy, unspecified, second trimester: Secondary | ICD-10-CM

## 2014-10-29 DIAGNOSIS — O9989 Other specified diseases and conditions complicating pregnancy, childbirth and the puerperium: Secondary | ICD-10-CM | POA: Diagnosis not present

## 2014-10-29 DIAGNOSIS — R1031 Right lower quadrant pain: Secondary | ICD-10-CM | POA: Insufficient documentation

## 2014-10-29 DIAGNOSIS — O162 Unspecified maternal hypertension, second trimester: Secondary | ICD-10-CM

## 2014-10-29 DIAGNOSIS — Z3A32 32 weeks gestation of pregnancy: Secondary | ICD-10-CM | POA: Diagnosis not present

## 2014-10-29 DIAGNOSIS — O34219 Maternal care for unspecified type scar from previous cesarean delivery: Secondary | ICD-10-CM

## 2014-10-29 LAB — URINALYSIS, ROUTINE W REFLEX MICROSCOPIC
BILIRUBIN URINE: NEGATIVE
Glucose, UA: NEGATIVE mg/dL
Ketones, ur: NEGATIVE mg/dL
NITRITE: NEGATIVE
PH: 7 (ref 5.0–8.0)
Protein, ur: NEGATIVE mg/dL
Specific Gravity, Urine: 1.01 (ref 1.005–1.030)
Urobilinogen, UA: 0.2 mg/dL (ref 0.0–1.0)

## 2014-10-29 LAB — URINE MICROSCOPIC-ADD ON

## 2014-10-29 NOTE — MAU Provider Note (Signed)
History   CSN: [id number]  Arrival date and time: 10/29/14 1943   None    HPI  Patient is 29 y.o. [id number] here with complaints of RLQ pain. Patient states that last night when she was at work she have 5 episodes of sharp pain that came and went. Pain was relieved when she rested but seemed to be exacerbated when she was up. She is able to point to the exact area in RLQ. Pain does nto radiate. Patient also endorses leg swelling. 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 Swelling decreases with leg elevation.   +FM, denies LOF, VB, contractions, vaginal discharge.   OB History    Gravida Para Term Preterm AB TAB SAB Ectopic Multiple Living   4 1 1  2 2    1       Past Medical History  Diagnosis Date  . Gestational hypertension     Past Surgical History  Procedure Laterality Date  . Cesarean section    . Mouth surgery      Family History  Problem Relation Age of Onset  . Diabetes Maternal Grandmother   . Kidney disease Maternal Grandmother   . Heart disease Mother     History  Substance Use Topics  . Smoking status: Never Smoker   . Smokeless tobacco: Never Used  . Alcohol Use: No    Allergies: No Known Allergies  No prescriptions prior to admission    Review of Systems  Constitutional: Negative for fever.  Eyes: Negative for blurred vision.  Respiratory: Negative for shortness of breath.   Cardiovascular: Negative for chest pain and leg swelling.  Gastrointestinal: Positive for abdominal pain. Negative for nausea and vomiting.  Genitourinary: Negative for dysuria.  Neurological: Negative for headaches.  Also per HPI  Physical Exam   Blood pressure 141/76, pulse 91, temperature 98.9 F (37.2 C), temperature source Oral, resp. rate 16, height 5' 9.5" (1.765 m), weight 202 lb (91.627 kg), last menstrual period 03/23/2014, SpO2 100 %.  Physical Exam  Constitutional: She is oriented to person, place, and time. She appears well-developed and  well-nourished. No distress.  HENT:  Head: Normocephalic and atraumatic.  Eyes: EOM are normal.  Cardiovascular: Normal rate, regular rhythm, normal heart sounds and intact distal pulses.   Respiratory: Effort normal and breath sounds normal.  GI: Soft. Bowel sounds are normal. There is no tenderness.  Musculoskeletal: Normal range of motion. She exhibits no edema or tenderness.  Neurological: She is alert and oriented to person, place, and time.  Skin: Skin is warm and dry.  Psychiatric: She has a normal mood and affect.    Dilation: Closed Effacement (%): Thick Cervical Position: Posterior Exam by:: D Simpson RN  Results for orders placed or performed during the hospital encounter of 10/29/14 (from the past 24 hour(s))  Urinalysis, Routine w reflex microscopic (not [facility])     Status: Abnormal   Collection Time: 10/29/14  7:55 PM  Result Value Ref Range   Color, Urine YELLOW YELLOW   APPearance CLOUDY (A) CLEAR   Specific Gravity, Urine 1.010 1.005 - 1.030   pH 7.0 5.0 - 8.0   Glucose, UA NEGATIVE NEGATIVE mg/dL   Hgb urine dipstick TRACE (A) NEGATIVE   Bilirubin Urine NEGATIVE NEGATIVE   Ketones, ur NEGATIVE NEGATIVE mg/dL   Protein, ur NEGATIVE NEGATIVE mg/dL   Urobilinogen, UA 0.2 0.0 - 1.0 mg/dL   Nitrite NEGATIVE NEGATIVE   Leukocytes, UA LARGE (A)  NEGATIVE  Urine microscopic-add on     Status: Abnormal   Collection Time: 10/29/14  7:55 PM  Result Value Ref Range   Squamous Epithelial / LPF FEW (A) RARE   WBC, UA 11-20 <3 WBC/hpf   RBC / HPF 0-2 <3 RBC/hpf   Bacteria, UA RARE RARE    MAU Course  Procedures - None  MDM: -NST reactive; no contractions on toco monitoring -UA as above  Assessment and Plan  A: Patient is 29 y.o. [id number] [redacted]w[redacted]d reporting sharp RLQ pain likely secondary to round ligament pain.    P: Discharge home pt stable - Reviewed findings and my conclusion - fetal kick counts reinforced - preterm labor precautions dicussed - Handout  given - Follow-up with OB provider   Caryl Ada, DO PGY-1, [facility] 10/29/2014, 11:58 PM

## 2014-10-29 NOTE — Discharge Instructions (Signed)
Abdominal Pain During Pregnancy °Belly (abdominal) pain is common during pregnancy. Most of the time, it is not a serious problem. Other times, it can be a sign that something is wrong with the pregnancy. Always tell your doctor if you have belly pain. °HOME CARE °Monitor your belly pain for any changes. The following actions may help you feel better: °· Do not have sex (intercourse) or put anything in your vagina until you feel better. °· Rest until your pain stops. °· Drink clear fluids if you feel sick to your stomach (nauseous). Do not eat solid food until you feel better. °· Only take medicine as told by your doctor. °· Keep all doctor visits as told. °GET HELP RIGHT AWAY IF:  °· You are bleeding, leaking fluid, or pieces of tissue come out of your vagina. °· You have more pain or cramping. °· You keep throwing up (vomiting). °· You have pain when you pee (urinate) or have blood in your pee. °· You have a fever. °· You do not feel your baby moving as much. °· You feel very weak or feel like passing out. °· You have trouble breathing, with or without belly pain. °· You have a very bad headache and belly pain. °· You have fluid leaking from your vagina and belly pain. °· You keep having watery poop (diarrhea). °· Your belly pain does not go away after resting, or the pain gets worse. °MAKE SURE YOU:  °· Understand these instructions. °· Will watch your condition. °· Will get help right away if you are not doing well or get worse. °Document Released: 04/06/2009 Document Revised: 12/19/2012 Document Reviewed: 11/15/2012 °ExitCare® Patient Information ©2015 [employer]. This information is not intended to replace advice given to you by your health care provider. Make sure you discuss any questions you have with your health care provider. °Preterm Labor Information °Preterm labor is when labor starts before you are [redacted] weeks pregnant. The normal length of pregnancy is 39 to 41 weeks.  °CAUSES  °The cause of preterm  labor is not often known. The most common known cause is infection. °RISK FACTORS °· Having a history of preterm labor. °· Having your water break before it should. °· Having a placenta that covers the opening of the cervix. °· Having a placenta that breaks away from the uterus. °· Having a cervix that is too weak to hold the baby in the uterus. °· Having too much fluid in the amniotic sac. °· Taking drugs or smoking while pregnant. °· Not gaining enough weight while pregnant. °· Being younger than 18 and older than 27 years old. °· Having a low income. °· Being African American. °SYMPTOMS °· Period-like cramps, belly (abdominal) pain, or back pain. °· Contractions that are regular, as often as six in an hour. They may be mild or painful. °· Contractions that start at the top of the belly. They then move to the lower belly and back. °· Lower belly pressure that seems to get stronger. °· Bleeding from the vagina. °· Fluid leaking from the vagina. °TREATMENT  °Treatment depends on: °· Your condition. °· The condition of your baby. °· How many weeks pregnant you are. °Your doctor may have you: °· Take medicine to stop contractions. °· Stay in bed except to use the restroom (bed rest). °· Stay in the hospital. °WHAT SHOULD YOU DO IF YOU THINK YOU ARE IN PRETERM LABOR? °Call your doctor right away. You need to go to the hospital right away.  °  HOW CAN YOU PREVENT PRETERM LABOR IN FUTURE PREGNANCIES? °· Stop smoking, if you smoke. °· Maintain healthy weight gain. °· Do not take drugs or be around chemicals that are not needed. °· Tell your doctor if you think you have an infection. °· Tell your doctor if you had a preterm labor before. °Document Released: 07/15/2008 Document Revised: 02/06/2013 Document Reviewed: 07/15/2008 °ExitCare® Patient Information ©2015 [employer]. This information is not intended to replace advice given to you by your health care provider. Make sure you discuss any questions you have with your  health care provider. ° °

## 2014-10-29 NOTE — MAU Note (Signed)
Pt presents with complaint of increasing swelling in her ankles, has been having sharp pain in right lower abd off/on today. Denies bleeding or leaking fluid. Has had some elevated pressures in the clinic.

## 2014-10-30 ENCOUNTER — Ambulatory Visit (HOSPITAL_COMMUNITY): Payer: Medicaid Other

## 2014-11-05 ENCOUNTER — Ambulatory Visit (HOSPITAL_COMMUNITY): Payer: Medicaid Other | Attending: Family

## 2014-11-13 ENCOUNTER — Encounter: Payer: Self-pay | Admitting: Family Medicine

## 2014-11-13 ENCOUNTER — Ambulatory Visit (INDEPENDENT_AMBULATORY_CARE_PROVIDER_SITE_OTHER): Payer: Medicaid Other | Admitting: Family Medicine

## 2014-11-13 VITALS — BP 139/77 | HR 84 | Temp 98.7°F | Wt 199.7 lb

## 2014-11-13 DIAGNOSIS — O163 Unspecified maternal hypertension, third trimester: Secondary | ICD-10-CM | POA: Diagnosis not present

## 2014-11-13 DIAGNOSIS — O0993 Supervision of high risk pregnancy, unspecified, third trimester: Secondary | ICD-10-CM | POA: Diagnosis not present

## 2014-11-13 DIAGNOSIS — O3421 Maternal care for scar from previous cesarean delivery: Secondary | ICD-10-CM

## 2014-11-13 DIAGNOSIS — O162 Unspecified maternal hypertension, second trimester: Secondary | ICD-10-CM

## 2014-11-13 DIAGNOSIS — O34219 Maternal care for unspecified type scar from previous cesarean delivery: Secondary | ICD-10-CM

## 2014-11-13 DIAGNOSIS — O0992 Supervision of high risk pregnancy, unspecified, second trimester: Secondary | ICD-10-CM

## 2014-11-13 LAB — POCT URINALYSIS DIP (DEVICE)
GLUCOSE, UA: NEGATIVE mg/dL
Hgb urine dipstick: NEGATIVE
Ketones, ur: >=160 mg/dL — AB
Leukocytes, UA: NEGATIVE
NITRITE: NEGATIVE
PROTEIN: 30 mg/dL — AB
Specific Gravity, Urine: 1.02 (ref 1.005–1.030)
Urobilinogen, UA: 2 mg/dL — ABNORMAL HIGH (ref 0.0–1.0)
pH: 6.5 (ref 5.0–8.0)

## 2014-11-13 NOTE — Progress Notes (Signed)
Subjective:  Brittany Flores is a 29 y.o. G4P1021 at 1784w2d being seen today for ongoing prenatal care.  Patient reports no complaints.  Contractions: Irritability.  Vag. Bleeding: None. Movement: Present. Denies leaking of fluid.   The following portions of the patient's history were reviewed and updated as appropriate: allergies, current medications, past family history, past medical history, past social history, past surgical history and problem list.   Objective:   Filed Vitals:   11/13/14 1125  BP: 139/77  Pulse: 84  Temp: 98.7 F (37.1 C)  Weight: 199 lb 11.2 oz (90.583 kg)    Fetal Status: Fetal Heart Rate (bpm): 138 Fundal Height: 33 cm Movement: Present     General:  Alert, oriented and cooperative. Patient is in no acute distress.  Skin: Skin is warm and dry. No rash noted.   Cardiovascular: Normal heart rate noted  Respiratory: Normal respiratory effort, no problems with respiration noted  Abdomen: Soft, gravid, appropriate for gestational age. Pain/Pressure: Present     Vaginal: Vag. Bleeding: None.       Cervix: Not evaluated        Extremities: Normal range of motion.  Edema: Trace  Mental Status: Normal mood and affect. Normal behavior. Normal judgment and thought content.   Urinalysis: Urine Protein: 1+ Urine Glucose: Negative NST reviewed and reactive.  Assessment and Plan:  Pregnancy: G4P1021 at 3984w2d  1. Hypertension affecting pregnancy in third trimester, antepartum Begin 2x/wk testing - US OB Follow Up; Future  2. Previous cesarean delivery affecting pregnancy, antepartum Desires TOLAC  3. Supervision of high risk pregnancy in second trimester Continue routine prenatal care.   Preterm labor symptoms and general obstetric precautions including but not limited to vaginal bleeding, contractions, leaking of fluid and fetal movement were reviewed in detail with the patient. Please refer to After Visit Summary for other counseling recommendations.  Return  in 1 week (on 11/20/2014).   Reva Boresanya S Charmeka [place], MD

## 2014-11-13 NOTE — Progress Notes (Signed)
Ultrasound for growth scheduled for November 24, 2014 @ 12:30PM( first available).

## 2014-11-13 NOTE — Patient Instructions (Signed)
Preeclampsia and Eclampsia Preeclampsia is a serious condition that develops only during pregnancy. It is also called toxemia of pregnancy. This condition causes high blood pressure along with other symptoms, such as swelling and headaches. These may develop as the condition gets worse. Preeclampsia may occur 20 weeks or later into your pregnancy.  Diagnosing and treating preeclampsia early is very important. If not treated early, it can cause serious problems for you and your baby. One problem it can lead to is eclampsia, which is a condition that causes muscle jerking or shaking (convulsions) in the mother. Delivering your baby is the best treatment for preeclampsia or eclampsia.  RISK FACTORS The cause of preeclampsia is not known. You may be more likely to develop preeclampsia if you have certain risk factors. These include:   Being pregnant for the first time.  Having preeclampsia in a past pregnancy.  Having a family history of preeclampsia.  Having high blood pressure.  Being pregnant with twins or triplets.  Being 35 or older.  Being African American.  Having kidney disease or diabetes.  Having medical conditions such as lupus or blood diseases.  Being very overweight (obese). SIGNS AND SYMPTOMS  The earliest signs of preeclampsia are:  High blood pressure.  Increased protein in your urine. Your health care provider will check for this at every prenatal visit. Other symptoms that can develop include:   Severe headaches.  Sudden weight gain.  Swelling of your hands, face, legs, and feet.  Feeling sick to your stomach (nauseous) and throwing up (vomiting).  Vision problems (blurred or double vision).  Numbness in your face, arms, legs, and feet.  Dizziness.  Slurred speech.  Sensitivity to bright lights.  Abdominal pain. DIAGNOSIS  There are no screening tests for preeclampsia. Your health care provider will ask you about symptoms and check for signs of  preeclampsia during your prenatal visits. You may also have tests, including:  Urine testing.  Blood testing.  Checking your baby's heart rate.  Checking the health of your baby and your placenta using images created with sound waves (ultrasound). TREATMENT  You can work out the best treatment approach together with your health care provider. It is very important to keep all prenatal appointments. If you have an increased risk of preeclampsia, you may need more frequent prenatal exams.  Your health care provider may prescribe bed rest.  You may have to eat as little salt as possible.  You may need to take medicine to lower your blood pressure if the condition does not respond to more conservative measures.  You may need to stay in the hospital if your condition is severe. There, treatment will focus on controlling your blood pressure and fluid retention. You may also need to take medicine to prevent seizures.  If the condition gets worse, your baby may need to be delivered early to protect you and the baby. You may have your labor started with medicine (be induced), or you may have a cesarean delivery.  Preeclampsia usually goes away after the baby is born. HOME CARE INSTRUCTIONS   Only take over-the-counter or prescription medicines as directed by your health care provider.  Lie on your left side while resting. This keeps pressure off your baby.  Elevate your feet while resting.  Get regular exercise. Ask your health care provider what type of exercise is safe for you.  Avoid caffeine and alcohol.  Do not smoke.  Drink 6-8 glasses of water every day.  Eat a balanced diet   that is low in salt. Do not add salt to your food.  Avoid stressful situations as much as possible.  Get plenty of rest and sleep.  Keep all prenatal appointments and tests as scheduled. SEEK MEDICAL CARE IF:  You are gaining more weight than expected.  You have any headaches, abdominal pain, or  nausea.  You are bruising more than usual.  You feel dizzy or light-headed. SEEK IMMEDIATE MEDICAL CARE IF:   You develop sudden or severe swelling anywhere in your body. This usually happens in the legs.  You gain 5 lb (2.3 kg) or more in a week.  You have a severe headache, dizziness, problems with your vision, or confusion.  You have severe abdominal pain.  You have lasting nausea or vomiting.  You have a seizure.  You have trouble moving any part of your body.  You develop numbness in your body.  You have trouble speaking.  You have any abnormal bleeding.  You develop a stiff neck.  You pass out. MAKE SURE YOU:   Understand these instructions.  Will watch your condition.  Will get help right away if you are not doing well or get worse. Document Released: 04/15/2000 Document Revised: 04/23/2013 Document Reviewed: 02/08/2013 Exit[employer] Patient Information 2015 [employer]. This information is not intended to replace advice given to you by your health care provider. Make sure you discuss any questions you have with your health care provider.  Breastfeeding Deciding to breastfeed is one of the best choices you can make for you and your baby. A change in hormones during pregnancy causes your breast tissue to grow and increases the number and size of your milk ducts. These hormones also allow proteins, sugars, and fats from your blood supply to make breast milk in your milk-producing glands. Hormones prevent breast milk from being released before your baby is born as well as prompt milk flow after birth. Once breastfeeding has begun, thoughts of your baby, as well as his or her sucking or crying, can stimulate the release of milk from your milk-producing glands.  BENEFITS OF BREASTFEEDING For Your Baby  Your first milk (colostrum) helps your baby's digestive system function better.   There are antibodies in your milk that help your baby fight off infections.   Your  baby has a lower incidence of asthma, allergies, and sudden infant death syndrome.   The nutrients in breast milk are better for your baby than infant formulas and are designed uniquely for your baby's needs.   Breast milk improves your baby's brain development.   Your baby is less likely to develop other conditions, such as childhood obesity, asthma, or type 2 diabetes mellitus.  For You   Breastfeeding helps to create a very special bond between you and your baby.   Breastfeeding is convenient. Breast milk is always available at the correct temperature and costs nothing.   Breastfeeding helps to burn calories and helps you lose the weight gained during pregnancy.   Breastfeeding makes your uterus contract to its prepregnancy size faster and slows bleeding (lochia) after you give birth.   Breastfeeding helps to lower your risk of developing type 2 diabetes mellitus, osteoporosis, and breast or ovarian cancer later in life. SIGNS THAT YOUR BABY IS HUNGRY Early Signs of Hunger  Increased alertness or activity.  Stretching.  Movement of the head from side to side.  Movement of the head and opening of the mouth when the corner of the mouth or cheek is stroked (rooting).    Increased sucking sounds, smacking lips, cooing, sighing, or squeaking.  Hand-to-mouth movements.  Increased sucking of fingers or hands. Late Signs of Hunger  Fussing.  Intermittent crying. Extreme Signs of Hunger Signs of extreme hunger will require calming and consoling before your baby will be able to breastfeed successfully. Do not wait for the following signs of extreme hunger to occur before you initiate breastfeeding:   Restlessness.  A loud, strong cry.   Screaming. BREASTFEEDING BASICS Breastfeeding Initiation  Find a comfortable place to sit or lie down, with your neck and back well supported.  Place a pillow or rolled up blanket under your baby to bring him or her to the level of  your breast (if you are seated). Nursing pillows are specially designed to help support your arms and your baby while you breastfeed.  Make sure that your baby's abdomen is facing your abdomen.   Gently massage your breast. With your fingertips, massage from your chest wall toward your nipple in a circular motion. This encourages milk flow. You may need to continue this action during the feeding if your milk flows slowly.  Support your breast with 4 fingers underneath and your thumb above your nipple. Make sure your fingers are well away from your nipple and your baby's mouth.   Stroke your baby's lips gently with your finger or nipple.   When your baby's mouth is open wide enough, quickly bring your baby to your breast, placing your entire nipple and as much of the colored area around your nipple (areola) as possible into your baby's mouth.   More areola should be visible above your baby's upper lip than below the lower lip.   Your baby's tongue should be between his or her lower gum and your breast.   Ensure that your baby's mouth is correctly positioned around your nipple (latched). Your baby's lips should create a seal on your breast and be turned out (everted).  It is common for your baby to suck about 2-3 minutes in order to start the flow of breast milk. Latching Teaching your baby how to latch on to your breast properly is very important. An improper latch can cause nipple pain and decreased milk supply for you and poor weight gain in your baby. Also, if your baby is not latched onto your nipple properly, he or she may swallow some air during feeding. This can make your baby fussy. Burping your baby when you switch breasts during the feeding can help to get rid of the air. However, teaching your baby to latch on properly is still the best way to prevent fussiness from swallowing air while breastfeeding. Signs that your baby has successfully latched on to your nipple:    Silent  tugging or silent sucking, without causing you pain.   Swallowing heard between every 3-4 sucks.    Muscle movement above and in front of his or her ears while sucking.  Signs that your baby has not successfully latched on to nipple:   Sucking sounds or smacking sounds from your baby while breastfeeding.  Nipple pain. If you think your baby has not latched on correctly, slip your finger into the corner of your baby's mouth to break the suction and place it between your baby's gums. Attempt breastfeeding initiation again. Signs of Successful Breastfeeding Signs from your baby:   A gradual decrease in the number of sucks or complete cessation of sucking.   Falling asleep.   Relaxation of his or her body.     Retention of a small amount of milk in his or her mouth.   Letting go of your breast by himself or herself. Signs from you:  Breasts that have increased in firmness, weight, and size 1-3 hours after feeding.   Breasts that are softer immediately after breastfeeding.  Increased milk volume, as well as a change in milk consistency and color by the fifth day of breastfeeding.   Nipples that are not sore, cracked, or bleeding. Signs That Your Baby is Getting Enough Milk  Wetting at least 3 diapers in a 24-hour period. The urine should be clear and pale yellow by age 5 days.  At least 3 stools in a 24-hour period by age 5 days. The stool should be soft and yellow.  At least 3 stools in a 24-hour period by age 7 days. The stool should be seedy and yellow.  No loss of weight greater than 10% of birth weight during the first 3 days of age.  Average weight gain of 4-7 ounces (113-198 g) per week after age 4 days.  Consistent daily weight gain by age 5 days, without weight loss after the age of 2 weeks. After a feeding, your baby may spit up a small amount. This is common. BREASTFEEDING FREQUENCY AND DURATION Frequent feeding will help you make more milk and can prevent  sore nipples and breast engorgement. Breastfeed when you feel the need to reduce the fullness of your breasts or when your baby shows signs of hunger. This is called "breastfeeding on demand." Avoid introducing a pacifier to your baby while you are working to establish breastfeeding (the first 4-6 weeks after your baby is born). After this time you may choose to use a pacifier. Research has shown that pacifier use during the first year of a baby's life decreases the risk of sudden infant death syndrome (SIDS). Allow your baby to feed on each breast as long as he or she wants. Breastfeed until your baby is finished feeding. When your baby unlatches or falls asleep while feeding from the first breast, offer the second breast. Because newborns are often sleepy in the first few weeks of life, you may need to awaken your baby to get him or her to feed. Breastfeeding times will vary from baby to baby. However, the following rules can serve as a guide to help you ensure that your baby is properly fed:  Newborns (babies 4 weeks of age or younger) may breastfeed every 1-3 hours.  Newborns should not go longer than 3 hours during the day or 5 hours during the night without breastfeeding.  You should breastfeed your baby a minimum of 8 times in a 24-hour period until you begin to introduce solid foods to your baby at around 6 months of age. BREAST MILK PUMPING Pumping and storing breast milk allows you to ensure that your baby is exclusively fed your breast milk, even at times when you are unable to breastfeed. This is especially important if you are going back to work while you are still breastfeeding or when you are not able to be present during feedings. Your lactation consultant can give you guidelines on how long it is safe to store breast milk.  A breast pump is a machine that allows you to pump milk from your breast into a sterile bottle. The pumped breast milk can then be stored in a refrigerator or freezer.  Some breast pumps are operated by hand, while others use electricity. Ask your lactation consultant which type   will work best for you. Breast pumps can be purchased, but some hospitals and breastfeeding support groups lease breast pumps on a monthly basis. A lactation consultant can teach you how to hand express breast milk, if you prefer not to use a pump.  CARING FOR YOUR BREASTS WHILE YOU BREASTFEED Nipples can become dry, cracked, and sore while breastfeeding. The following recommendations can help keep your breasts moisturized and healthy:  Avoid using soap on your nipples.   Wear a supportive bra. Although not required, special nursing bras and tank tops are designed to allow access to your breasts for breastfeeding without taking off your entire bra or top. Avoid wearing underwire-style bras or extremely tight bras.  Air dry your nipples for 3-4minutes after each feeding.   Use only cotton bra pads to absorb leaked breast milk. Leaking of breast milk between feedings is normal.   Use lanolin on your nipples after breastfeeding. Lanolin helps to maintain your skin's normal moisture barrier. If you use pure lanolin, you do not need to wash it off before feeding your baby again. Pure lanolin is not toxic to your baby. You may also hand express a few drops of breast milk and gently massage that milk into your nipples and allow the milk to air dry. In the first few weeks after giving birth, some women experience extremely full breasts (engorgement). Engorgement can make your breasts feel heavy, warm, and tender to the touch. Engorgement peaks within 3-5 days after you give birth. The following recommendations can help ease engorgement:  Completely empty your breasts while breastfeeding or pumping. You may want to start by applying warm, moist heat (in the shower or with warm water-soaked hand towels) just before feeding or pumping. This increases circulation and helps the milk flow. If your baby  does not completely empty your breasts while breastfeeding, pump any extra milk after he or she is finished.  Wear a snug bra (nursing or regular) or tank top for 1-2 days to signal your body to slightly decrease milk production.  Apply ice packs to your breasts, unless this is too uncomfortable for you.  Make sure that your baby is latched on and positioned properly while breastfeeding. If engorgement persists after 48 hours of following these recommendations, contact your health care provider or a lactation consultant. OVERALL HEALTH CARE RECOMMENDATIONS WHILE BREASTFEEDING  Eat healthy foods. Alternate between meals and snacks, eating 3 of each per day. Because what you eat affects your breast milk, some of the foods may make your baby more irritable than usual. Avoid eating these foods if you are sure that they are negatively affecting your baby.  Drink milk, fruit juice, and water to satisfy your thirst (about 10 glasses a day).   Rest often, relax, and continue to take your prenatal vitamins to prevent fatigue, stress, and anemia.  Continue breast self-awareness checks.  Avoid chewing and smoking tobacco.  Avoid alcohol and drug use. Some medicines that may be harmful to your baby can pass through breast milk. It is important to ask your health care provider before taking any medicine, including all over-the-counter and prescription medicine as well as vitamin and herbal supplements. It is possible to become pregnant while breastfeeding. If birth control is desired, ask your health care provider about options that will be safe for your baby. SEEK MEDICAL CARE IF:   You feel like you want to stop breastfeeding or have become frustrated with breastfeeding.  You have painful breasts or nipples.  Your   nipples are cracked or bleeding.  Your breasts are red, tender, or warm.  You have a swollen area on either breast.  You have a fever or chills.  You have nausea or  vomiting.  You have drainage other than breast milk from your nipples.  Your breasts do not become full before feedings by the fifth day after you give birth.  You feel sad and depressed.  Your baby is too sleepy to eat well.  Your baby is having trouble sleeping.   Your baby is wetting less than 3 diapers in a 24-hour period.  Your baby has less than 3 stools in a 24-hour period.  Your baby's skin or the white part of his or her eyes becomes yellow.   Your baby is not gaining weight by 5 days of age. SEEK IMMEDIATE MEDICAL CARE IF:   Your baby is overly tired (lethargic) and does not want to wake up and feed.  Your baby develops an unexplained fever. Document Released: 04/18/2005 Document Revised: 04/23/2013 Document Reviewed: 10/10/2012 Exit[employer] Patient Information 2015 [employer]. This information is not intended to replace advice given to you by your health care provider. Make sure you discuss any questions you have with your health care provider.  

## 2014-11-13 NOTE — Progress Notes (Signed)
Reports no appetite still, not eating very much at meals  Reviewed tip of week with patient

## 2014-11-17 ENCOUNTER — Ambulatory Visit (INDEPENDENT_AMBULATORY_CARE_PROVIDER_SITE_OTHER): Payer: Medicaid Other | Admitting: *Deleted

## 2014-11-17 VITALS — BP 139/78 | HR 83

## 2014-11-17 DIAGNOSIS — O10913 Unspecified pre-existing hypertension complicating pregnancy, third trimester: Secondary | ICD-10-CM | POA: Diagnosis not present

## 2014-11-17 NOTE — Progress Notes (Signed)
NST performed today was reviewed and was found to be reactive.  AFI normal at 10.6 cm.  Continue recommended antenatal testing and prenatal care.

## 2014-11-18 NOTE — Addendum Note (Signed)
Addended by: Marchelle FolksAY, Idell Hissong L on: 11/18/2014 11:00 AM   Modules accepted: Orders

## 2014-11-20 ENCOUNTER — Ambulatory Visit (INDEPENDENT_AMBULATORY_CARE_PROVIDER_SITE_OTHER): Payer: Medicaid Other | Admitting: Family Medicine

## 2014-11-20 VITALS — BP 148/70 | HR 88 | Wt 206.6 lb

## 2014-11-20 DIAGNOSIS — O0992 Supervision of high risk pregnancy, unspecified, second trimester: Secondary | ICD-10-CM

## 2014-11-20 DIAGNOSIS — O10913 Unspecified pre-existing hypertension complicating pregnancy, third trimester: Secondary | ICD-10-CM

## 2014-11-20 DIAGNOSIS — O0993 Supervision of high risk pregnancy, unspecified, third trimester: Secondary | ICD-10-CM | POA: Diagnosis not present

## 2014-11-20 LAB — POCT URINALYSIS DIP (DEVICE)
Bilirubin Urine: NEGATIVE
GLUCOSE, UA: NEGATIVE mg/dL
Hgb urine dipstick: NEGATIVE
KETONES UR: NEGATIVE mg/dL
NITRITE: NEGATIVE
PH: 7 (ref 5.0–8.0)
Protein, ur: NEGATIVE mg/dL
SPECIFIC GRAVITY, URINE: 1.02 (ref 1.005–1.030)
Urobilinogen, UA: 1 mg/dL (ref 0.0–1.0)

## 2014-11-20 NOTE — Progress Notes (Signed)
7 lb weight gain in 1 week.  [place] for growth/BPP scheduled on 7/25

## 2014-11-20 NOTE — Patient Instructions (Signed)
Third Trimester of Pregnancy The third trimester is from week 29 through week 42, months 7 through 9. The third trimester is a time when the fetus is growing rapidly. At the end of the ninth month, the fetus is about 20 inches in length and weighs 6-10 pounds.  BODY CHANGES Your body goes through many changes during pregnancy. The changes vary from woman to woman.   Your weight will continue to increase. You can expect to gain 25-35 pounds (11-16 kg) by the end of the pregnancy.  You may begin to get stretch marks on your hips, abdomen, and breasts.  You may urinate more often because the fetus is moving lower into your pelvis and pressing on your bladder.  You may develop or continue to have heartburn as a result of your pregnancy.  You may develop constipation because certain hormones are causing the muscles that push waste through your intestines to slow down.  You may develop hemorrhoids or swollen, bulging veins (varicose veins).  You may have pelvic pain because of the weight gain and pregnancy hormones relaxing your joints between the bones in your pelvis. Backaches may result from overexertion of the muscles supporting your posture.  You may have changes in your hair. These can include thickening of your hair, rapid growth, and changes in texture. Some women also have hair loss during or after pregnancy, or hair that feels dry or thin. Your hair will most likely return to normal after your baby is born.  Your breasts will continue to grow and be tender. A yellow discharge may leak from your breasts called colostrum.  Your belly button may stick out.  You may feel short of breath because of your expanding uterus.  You may notice the fetus "dropping," or moving lower in your abdomen.  You may have a bloody mucus discharge. This usually occurs a few days to a week before labor begins.  Your cervix becomes thin and soft (effaced) near your due date. WHAT TO EXPECT AT YOUR PRENATAL  EXAMS  You will have prenatal exams every 2 weeks until week 36. Then, you will have weekly prenatal exams. During a routine prenatal visit:  You will be weighed to make sure you and the fetus are growing normally.  Your blood pressure is taken.  Your abdomen will be measured to track your baby's growth.  The fetal heartbeat will be listened to.  Any test results from the previous visit will be discussed.  You may have a cervical check near your due date to see if you have effaced. At around 36 weeks, your caregiver will check your cervix. At the same time, your caregiver will also perform a test on the secretions of the vaginal tissue. This test is to determine if a type of bacteria, Group B streptococcus, is present. Your caregiver will explain this further. Your caregiver may ask you:  What your birth plan is.  How you are feeling.  If you are feeling the baby move.  If you have had any abnormal symptoms, such as leaking fluid, bleeding, severe headaches, or abdominal cramping.  If you have any questions. Other tests or screenings that may be performed during your third trimester include:  Blood tests that check for low iron levels (anemia).  Fetal testing to check the health, activity level, and growth of the fetus. Testing is done if you have certain medical conditions or if there are problems during the pregnancy. FALSE LABOR You may feel small, irregular contractions that   eventually go away. These are called Braxton Hicks contractions, or false labor. Contractions may last for hours, days, or even weeks before true labor sets in. If contractions come at regular intervals, intensify, or become painful, it is best to be seen by your caregiver.  SIGNS OF LABOR   Menstrual-like cramps.  Contractions that are 5 minutes apart or less.  Contractions that start on the top of the uterus and spread down to the lower abdomen and back.  A sense of increased pelvic pressure or back  pain.  A watery or bloody mucus discharge that comes from the vagina. If you have any of these signs before the 37th week of pregnancy, call your caregiver right away. You need to go to the hospital to get checked immediately. HOME CARE INSTRUCTIONS   Avoid all smoking, herbs, alcohol, and unprescribed drugs. These chemicals affect the formation and growth of the baby.  Follow your caregiver's instructions regarding medicine use. There are medicines that are either safe or unsafe to take during pregnancy.  Exercise only as directed by your caregiver. Experiencing uterine cramps is a good sign to stop exercising.  Continue to eat regular, healthy meals.  Wear a good support bra for breast tenderness.  Do not use hot tubs, steam rooms, or saunas.  Wear your seat belt at all times when driving.  Avoid raw meat, uncooked cheese, cat litter boxes, and soil used by cats. These carry germs that can cause birth defects in the baby.  Take your prenatal vitamins.  Try taking a stool softener (if your caregiver approves) if you develop constipation. Eat more high-fiber foods, such as fresh vegetables or fruit and whole grains. Drink plenty of fluids to keep your urine clear or pale yellow.  Take warm sitz baths to soothe any pain or discomfort caused by hemorrhoids. Use hemorrhoid cream if your caregiver approves.  If you develop varicose veins, wear support hose. Elevate your feet for 15 minutes, 3-4 times a day. Limit salt in your diet.  Avoid heavy lifting, wear low heal shoes, and practice good posture.  Rest a lot with your legs elevated if you have leg cramps or low back pain.  Visit your dentist if you have not gone during your pregnancy. Use a soft toothbrush to brush your teeth and be gentle when you floss.  A sexual relationship may be continued unless your caregiver directs you otherwise.  Do not travel far distances unless it is absolutely necessary and only with the approval  of your caregiver.  Take prenatal classes to understand, practice, and ask questions about the labor and delivery.  Make a trial run to the hospital.  Pack your hospital bag.  Prepare the baby's nursery.  Continue to go to all your prenatal visits as directed by your caregiver. SEEK MEDICAL CARE IF:  You are unsure if you are in labor or if your water has broken.  You have dizziness.  You have mild pelvic cramps, pelvic pressure, or nagging pain in your abdominal area.  You have persistent nausea, vomiting, or diarrhea.  You have a bad smelling vaginal discharge.  You have pain with urination. SEEK IMMEDIATE MEDICAL CARE IF:   You have a fever.  You are leaking fluid from your vagina.  You have spotting or bleeding from your vagina.  You have severe abdominal cramping or pain.  You have rapid weight loss or gain.  You have shortness of breath with chest pain.  You notice sudden or extreme swelling   of your face, hands, ankles, feet, or legs.  You have not felt your baby move in over an hour.  You have severe headaches that do not go away with medicine.  You have vision changes. Document Released: 04/12/2001 Document Revised: 04/23/2013 Document Reviewed: 06/19/2012 ExitCare Patient Information 2015 [employer]. This information is not intended to replace advice given to you by your health care provider. Make sure you discuss any questions you have with your health care provider.  

## 2014-11-20 NOTE — Progress Notes (Signed)
Subjective:  Brittany Flores is a 29 y.o. G4P1021 at [redacted]w[redacted]d being seen today for ongoing prenatal care.  Patient reports no complaints.  Contractions: Irritability.  Vag. Bleeding: None. Movement: Present. Denies leaking of fluid.   The following portions of the patient's history were reviewed and updated as appropriate: allergies, current medications, past family history, past medical history, past social history, past surgical history and problem list.   Objective:   Filed Vitals:   11/20/14 1425  BP: 148/70  Pulse: 88  Weight: 206 lb 9.6 oz (93.713 kg)    Fetal Status:     Movement: Present     General:  Alert, oriented and cooperative. Patient is in no acute distress.  Skin: Skin is warm and dry. No rash noted.   Cardiovascular: Normal heart rate noted  Respiratory: Normal respiratory effort, no problems with respiration noted  Abdomen: Soft, gravid, appropriate for gestational age. Pain/Pressure: Present     Vaginal: Vag. Bleeding: None.       Cervix: Not evaluated        Extremities: Normal range of motion.  Edema: Trace  Mental Status: Normal mood and affect. Normal behavior. Normal judgment and thought content.   Urinalysis: Urine Protein: Negative Urine Glucose: Negative  Assessment and Plan:  Pregnancy: G4P1021 at [redacted]w[redacted]d  1. Supervision of high risk pregnancy in second trimester FHT and Fundal height normal  2. Pre-existing hypertension, antepartum, third trimester NST reactive.  Continue twice weekly testing with weekly AFI - Fetal nonstress test  Preterm labor symptoms and general obstetric precautions including but not limited to vaginal bleeding, contractions, leaking of fluid and fetal movement were reviewed in detail with the patient. Please refer to After Visit Summary for other counseling recommendations.  No Follow-up on file.   Levie Heritage, DO

## 2014-11-24 ENCOUNTER — Encounter (HOSPITAL_COMMUNITY): Payer: Self-pay

## 2014-11-24 ENCOUNTER — Ambulatory Visit (HOSPITAL_COMMUNITY): Admission: RE | Admit: 2014-11-24 | Payer: Medicaid Other | Source: Ambulatory Visit

## 2014-11-24 ENCOUNTER — Ambulatory Visit (HOSPITAL_COMMUNITY)
Admission: RE | Admit: 2014-11-24 | Discharge: 2014-11-24 | Disposition: A | Discharge status: Home / Self Care | Payer: Medicaid Other | Source: Ambulatory Visit | Attending: Family Medicine | Admitting: Family Medicine

## 2014-11-24 DIAGNOSIS — O10913 Unspecified pre-existing hypertension complicating pregnancy, third trimester: Secondary | ICD-10-CM | POA: Diagnosis present

## 2014-11-24 DIAGNOSIS — Z3A35 35 weeks gestation of pregnancy: Secondary | ICD-10-CM | POA: Insufficient documentation

## 2014-11-24 DIAGNOSIS — O162 Unspecified maternal hypertension, second trimester: Secondary | ICD-10-CM

## 2014-11-24 DIAGNOSIS — O4100X0 Oligohydramnios, unspecified trimester, not applicable or unspecified: Secondary | ICD-10-CM | POA: Insufficient documentation

## 2014-11-24 NOTE — ED Notes (Signed)
Ninfa Meeker, RN in [facility] notified of pt.

## 2014-11-24 NOTE — ED Notes (Signed)
Pt instructed to go to [facility] for evaluation of SROM.  Pt needs to pick up her daughter and will return to [facility].  Dr. Claudean Severance gave pt strict instructions to return to [facility].

## 2014-11-25 ENCOUNTER — Inpatient Hospital Stay (HOSPITAL_COMMUNITY)
Admission: AD | Admit: 2014-11-25 | Discharge: 2014-11-25 | Disposition: A | Discharge status: Home / Self Care | Payer: Medicaid Other | Source: Ambulatory Visit | Attending: Obstetrics & Gynecology | Admitting: Obstetrics & Gynecology

## 2014-11-25 ENCOUNTER — Encounter (HOSPITAL_COMMUNITY): Payer: Self-pay | Admitting: *Deleted

## 2014-11-25 DIAGNOSIS — Z3A36 36 weeks gestation of pregnancy: Secondary | ICD-10-CM | POA: Diagnosis not present

## 2014-11-25 DIAGNOSIS — O163 Unspecified maternal hypertension, third trimester: Secondary | ICD-10-CM | POA: Insufficient documentation

## 2014-11-25 DIAGNOSIS — O3421 Maternal care for scar from previous cesarean delivery: Secondary | ICD-10-CM | POA: Insufficient documentation

## 2014-11-25 DIAGNOSIS — O4103X0 Oligohydramnios, third trimester, not applicable or unspecified: Secondary | ICD-10-CM

## 2014-11-25 LAB — AMNISURE RUPTURE OF MEMBRANE (ROM) NOT AT ARMC: Amnisure ROM: NEGATIVE

## 2014-11-25 NOTE — Discharge Instructions (Signed)
Fetal Biophysical Profile °This is a test that measures five different variables of the fetus: Heart rate, breathing movement, total movement of the baby, fetal muscle tone, the amount of amniotic fluid, and the heart rate activity of the fetus. The five variables are measured individually and contribute either a 2 or a 0 to the overall scoring of the test. The measurements are as follows: °· Fetal heart rate activity. This is measured and scored in the same way as a non-stress test. The fetal heart rate is considered reactive when there are movement-associated fetal heart rate increases of at least 15 beats per minute above baseline, and 15 seconds in duration over a 20-minute period. A score of 2 is given for reactivity, and a score of 0 indicates that the fetal heart rate is non-reactive. °· Fetal breathing movements. This is scored based on fetal breathing movements and indicate fetal well-being. Their absence may indicate a low oxygen level for the fetus. Fetal breathing increases in frequency and uniformity after the 36th week of pregnancy. To earn a score of 2, the fetus must have at least one episode of fetal breathing lasting at least 60 seconds within a 30-minute observation. Absence of this breathing is scored a 0 on the BPP. °· Fetal body movements. Fetal activity is a reflection of brain integrity and function. The presence of at least three episodes of fetal movements within a 30-minute period is given a score of 2. A score of 0 is given with two or less movements in this time period. Fetal activity is highest 1 to 3 hours after the mother has eaten a meal. °· Fetal tone. In the uterus, the fetus is normally in a position of flexion. This means the head is bent down towards the knees. The fetus also stretches, rolls, and moves in the uterus. The arms, legs, trunk, and head may be flexed and extended. A score of 2 is earned when there is at least one episode of active extension with return flexion. A  score of 0 is given for slow extension with a return to only partial flexion. Fetal movement not followed by return to flexion, limbs or spine in extension, and an open fetal hand score 0. °· Amniotic fluid volume. Amniotic fluid volume has been demonstrated to be a good method of predicting fetal distress. Too little amniotic fluid has been associated with fetal abnormalities, slow uterine growth, and over due pregnancy. A score of 2 is given for this when there is at least one pocket of amniotic fluid that measures 1 cm in a specific area. A score of 0 indicates either that fluid is absent in most areas of the uterine cavity or that the largest pocket of fluid measures less than 1 cm. °PREPARATION FOR TEST °No preparation or fasting is necessary. °NORMAL FINDINGS °· A score of 8-10 points (if amniotic fluid volume is adequate). °· Possible critical values: Less than 4 may necessitate immediate delivery of fetus. °Ranges for normal findings may vary among different laboratories and hospitals. You should always check with your doctor after having lab work or other tests done to discuss the meaning of your test results and whether your values are considered within normal limits. °MEANING OF TEST  °Your caregiver will go over the test results with you and discuss the importance and meaning of your results, as well as treatment options and the need for additional tests if necessary. °OBTAINING THE TEST RESULTS  °It is your responsibility to obtain your test   results. Ask the lab or department performing the test when and how you will get your results. Document Released: 08/19/2004 Document Revised: 07/11/2011 Document Reviewed: 03/28/2008 [facility] Patient Information 2015 [place], [place]. This information is not intended to replace advice given to you by your health care provider. Make sure you discuss any questions you have with your health care provider.  Oligohydramnios An unborn baby (fetus) lives in the  mother's uterus in a sac of amniotic fluid. This fluid:  Protects the fetus from trauma.  Helps the fetus move freely inside the uterus.  Helps the fetal lungs, kidneys, and digestive system develop.  Protects the baby from infections. Oligohydramnios is when there is not enough amniotic fluid in the amniotic sac. If this happens early in pregnancy, a fetus might not develop normally. If this happens in the second half of a pregnancy, a fetus might not grow as much as it should and could cause problems during delivery.  Oligohydramnios can also cause:  Pregnancy loss (miscarriage).  Premature birth.  Deformities of the face or body.  Problems with muscles and bones.  Pressure and compression on the umbilical cord, which decreases oxygen to the fetus.  Lung problems.  Stillbirth. CAUSES A cause cannot always be found.However, possible causes include:  A leak or a tear in the amniotic sac.  A problem with the placenta.  Having identical twins who share the same placenta.  A fetal birth defect. This is usually something in the fetal kidneys or urinary tract that has not developed as it should.  A pregnancy that goes past the due date.  Something that affects the mother's body, such as:  Dehydration.  High blood pressure.  Diabetes.  Some medications (examples include ibuprofen and blood pressure medicines).  A disease that affects the skin, joints, kidneys, and other organs (systemic lupus).  Birth defects. SYMPTOMS  There may be no symptoms.  Leaking fluid from the vagina.  Measuring smaller than usual uterus at a routine pregnancy exam. DIAGNOSIS To diagnose and evaluate oligohydramnios, your caregiver may:  Order a prenatal ultrasound test. This test:  Measures the amniotic fluid level. This will show if the amount of fluid is right for the stage of pregnancy.  Checks the fetal kidneys.  Checks fetal growth.  Evaluates the  placenta.  Confirm that you broke your water, if this is suspected by your caregiver.  Order a nonstress test. This noninvasive test is an assessment of fetal well-being.It monitors the fetal heart rate patterns over a 20-minute period.  Order a biophysical profile. This test measures and evaluates 5 observations of the baby (results of nonstress testing, fetal breathing, movements, muscle tone, and amount of amniotic fluid).  Assess fetal kick counts. Tell your caregiver if the baby appears to become less active.  Order a uterine artery Doppler study. This is a type of ultrasound. It can show if enough blood and nourishment are getting to the fetus through the placenta and umbilical cord.  Check your blood pressure.  Check your blood sugar. TREATMENT Treatment will depend on how low the fluid is, how far along in the pregnancy you are, and your overall health. Treatment options include:  Watching and waiting. You will need to be checked more often than normal.  Increasing your fluid intake. This may be done by mouth, or you might get the fluids through the vein (intravenously, IV).  Delivering the baby if recommended by your caregiver. HOME CARE INSTRUCTIONS  Only take medicine as directed by your  caregiver, especially if you have a medical problem (diabetes, high blood pressure).  Follow your caregiver's instructions regarding physical activity, especially if you have a medical problem (diabetes, high blood pressure).  Keep all prenatal care appointments.  Rest as much as possible. Your caregiver may put you on bed rest.  Drink enough fluids to keep your urine clear or pale yellow.  Eat a healthy and nourishing diet.  Do not smoke, drink alcohol, or take illegal drugs. SEEK MEDICAL CARE IF:  You have any questions or worries about your pregnancy.  You notice a decrease in fetal movement. SEEK IMMEDIATE MEDICAL CARE IF:   Fluid comes out of your vagina.  You start to  have labor pains (contractions).  You have a fever. Document Released: 08/03/2010 Document Revised: 09/02/2013 Document Reviewed: 08/03/2010 [facility] Patient Information 2015 [place], [place]. This information is not intended to replace advice given to you by your health care provider. Make sure you discuss any questions you have with your health care provider.

## 2014-11-25 NOTE — MAU Note (Signed)
Was at MFM today and was told her BPP was 6/8 and fluid was low. Was told to come have have fluid levels evaluated. Denies pain, LOF or vaginal bleeding. +FM

## 2014-11-25 NOTE — MAU Note (Signed)
Artelia Laroche CNM in L&D in delivery. To come to MAU and assess patient.

## 2014-11-25 NOTE — MAU Provider Note (Signed)
History     CSN: [id number]  Arrival date and time: 11/25/14 0028   First Provider Initiated Contact with Patient 11/25/14 0234      Chief Complaint  Patient presents with  . Non-stress Test  . Rupture of Membranes   HPI This is a 29 y.o. female at [redacted]w[redacted]d who presents for NST due to her BPP score today being 6/8 and oligohydramnios with AFI of 1.7cm.  SHe has been followed in the clinic and her pregnancy remarkable for chronic hypertension and previous C/S (desires TOLAC).  She had a BPP today which was remarkable for no points for movement. AFI was noted to be low. She was asked to go to MAU for monitoring and evaluation of possible SROM but had to arrange child care. Has now returned for that. Reports no pain, leaking or bleeding.    RN Note:  Expand All Collapse All   Was at MFM today and was told her BPP was 6/8 and fluid was low. Was told to come have have fluid levels evaluated. Denies pain, LOF or vaginal bleeding. +FM          OB History    Gravida Para Term Preterm AB TAB SAB Ectopic Multiple Living   4 1 1  2 2    1       Past Medical History  Diagnosis Date  . Gestational hypertension     Past Surgical History  Procedure Laterality Date  . Cesarean section    . Mouth surgery      Family History  Problem Relation Age of Onset  . Diabetes Maternal Grandmother   . Kidney disease Maternal Grandmother   . Heart disease Mother     History  Substance Use Topics  . Smoking status: Never Smoker   . Smokeless tobacco: Never Used  . Alcohol Use: No    Allergies: No Known Allergies  Prescriptions prior to admission  Medication Sig Dispense Refill Last Dose  . aspirin 81 MG tablet Take 81 mg by mouth daily.   11/25/2014 at Unknown time  . Elastic Bandages & Supports (MEDICAL COMPRESSION PANTYHOSE) MISC Pregnancy compression pantyhose, 15-20 compression 1 each 0 11/25/2014 at Unknown time  . Prenatal Vit-Fe Fumarate-FA (PRENATAL VITAMINS) 28-0.8 MG TABS Take  1 tablet by mouth daily. 30 tablet 5 11/25/2014 at Unknown time   Medical, Surgical, Family and Social histories reviewed and are listed above.  Medications and allergies reviewed.   Review of Systems  Constitutional: Negative for fever, chills and malaise/fatigue.  Gastrointestinal: Negative for nausea, vomiting, abdominal pain, diarrhea and constipation.  Neurological: Negative for dizziness, weakness and headaches.   Physical Exam   Blood pressure 134/82, pulse 91, temperature 98 F (36.7 C), temperature source Oral, resp. rate 18, height 5\' 7"  (1.702 m), weight 207 lb 9.6 oz (94.167 kg), last menstrual period 03/23/2014, SpO2 100 %.  Physical Exam  Constitutional: She is oriented to person, place, and time. She appears well-developed and well-nourished. No distress.  HENT:  Head: Normocephalic.  Cardiovascular: Normal rate and regular rhythm.   Respiratory: Effort normal. No respiratory distress.  GI: Soft. She exhibits no distension and no mass. There is no tenderness. There is no rebound and no guarding.  Genitourinary: No vaginal discharge (Cervix FT/50%/-3/vertex) found.  Fetal heart rate pattern is reactive Occasional contractions Category I tracing  Speculum exam done, with no pooling or ferning Amnisure sent to be sure  >> Negative   Musculoskeletal: Normal range of motion.  Neurological: She is  alert and oriented to person, place, and time.  Skin: Skin is warm and dry.  Psychiatric: She has a normal mood and affect.    MAU Course  Procedures  MDM Results for orders placed or performed during the hospital encounter of 11/25/14 (from the past 72 hour(s))  Amnisure rupture of membrane (rom)not at [facility]     Status: None   Collection Time: 11/25/14  2:05 AM  Result Value Ref Range   Amnisure ROM NEGATIVE      Assessment and Plan  A:  SIUP at [redacted]w[redacted]d       BPP score 6/8      Oligohydramnios       No evidence of ruptured membranes      Reactive NST now, Category  I  P:  Discussed with Dr Debroah Loop       Discharge home       Monitor fetal movement       Reevaluate AFI before the end of the week (Thursday)       If fluid still low, would induce labor before 37 weeks per DR Claudean Severance      Keep appt in High Risk clinic Thursday for prenatal care  [facility] 11/25/2014, 2:40 AM

## 2014-11-27 ENCOUNTER — Encounter: Payer: Self-pay | Admitting: Obstetrics & Gynecology

## 2014-11-27 ENCOUNTER — Ambulatory Visit (INDEPENDENT_AMBULATORY_CARE_PROVIDER_SITE_OTHER): Payer: Medicaid Other | Admitting: Obstetrics & Gynecology

## 2014-11-27 ENCOUNTER — Other Ambulatory Visit: Payer: Self-pay | Admitting: Obstetrics & Gynecology

## 2014-11-27 VITALS — BP 132/60 | HR 94 | Wt 202.6 lb

## 2014-11-27 DIAGNOSIS — O10913 Unspecified pre-existing hypertension complicating pregnancy, third trimester: Secondary | ICD-10-CM

## 2014-11-27 DIAGNOSIS — O4103X1 Oligohydramnios, third trimester, fetus 1: Secondary | ICD-10-CM | POA: Diagnosis not present

## 2014-11-27 LAB — OB RESULTS CONSOLE GC/CHLAMYDIA
Chlamydia: NEGATIVE
GC PROBE AMP, GENITAL: NEGATIVE

## 2014-11-27 LAB — OB RESULTS CONSOLE GBS: GBS: NEGATIVE

## 2014-11-27 LAB — POCT URINALYSIS DIP (DEVICE)
Bilirubin Urine: NEGATIVE
Glucose, UA: NEGATIVE mg/dL
KETONES UR: NEGATIVE mg/dL
NITRITE: NEGATIVE
Protein, ur: 30 mg/dL — AB
Specific Gravity, Urine: 1.02 (ref 1.005–1.030)
Urobilinogen, UA: 0.2 mg/dL (ref 0.0–1.0)
pH: 6 (ref 5.0–8.0)

## 2014-11-27 NOTE — Patient Instructions (Signed)
Return to clinic for any obstetric concerns or go to MAU for evaluation  

## 2014-11-27 NOTE — Progress Notes (Signed)
Pt reports that she was told the baby had low fluid on 7/25 and that she needs repeat AFI today.  Notes reviewed from visit to MFM and MAU on 7/25 and confirmed information provided by pt.  Pt reports decreased FM x3 days - felt good FM during NST today.

## 2014-11-27 NOTE — Progress Notes (Signed)
Subjective:  Brittany Flores is a 28 y.o. G4P1021 at [redacted]w[redacted]d being seen today for ongoing prenatal care.  Patient reports no complaints.  Contractions: Irritability.   . Movement: (!) Decreased. Denies leaking of fluid.   The following portions of the patient's history were reviewed and updated as appropriate: allergies, current medications, past family history, past medical history, past social history, past surgical history and problem list.   Objective:   Filed Vitals:   11/27/14 1417  BP: 132/60  Pulse: 94  Weight: 202 lb 9.6 oz (91.899 kg)    Fetal Status: Fetal Heart Rate (bpm): RNST Fundal Height: 36 cm Movement: (!) Decreased  Presentation: Vertex  General:  Alert, oriented and cooperative. Patient is in no acute distress.  Skin: Skin is warm and dry. No rash noted.   Cardiovascular: Normal heart rate noted  Respiratory: Normal respiratory effort, no problems with respiration noted  Abdomen: Soft, gravid, appropriate for gestational age. Pain/Pressure: Present     Vaginal:  .       Cervix: Exam revealed Dilation: 1.5 Effacement (%): 70 Station: -2  Extremities: Normal range of motion.  Edema: Trace  Mental Status: Normal mood and affect. Normal behavior. Normal judgment and thought content.   Urinalysis: Urine Protein: 1+ Urine Glucose: Negative  Assessment and Plan:  Pregnancy: G4P1021 at [redacted]w[redacted]d  1. Pre-existing hypertension, antepartum, third trimester Stable BP. NST performed today was reviewed and was found to be reactive. Normal AFI today at 11.1 cm. Continue recommended antenatal testing and prenatal care. Patient can return to work as long as BP remains stable. - Culture, beta strep (group b only) - GC/Chlamydia Probe Amp - Amniotic fluid index with NST  2. Low amniotic fluid, third trimester, fetus 1 Resolved today  Preterm labor symptoms and general obstetric precautions including but not limited to vaginal bleeding, contractions, leaking of fluid and fetal  movement were reviewed in detail with the patient. Please refer to After Visit Summary for other counseling recommendations.  Return for 2x/wk as scheduled.   Tereso Newcomer, MD

## 2014-11-28 LAB — GC/CHLAMYDIA PROBE AMP
CT Probe RNA: NEGATIVE
GC PROBE AMP APTIMA: NEGATIVE

## 2014-11-29 LAB — CULTURE, BETA STREP (GROUP B ONLY)

## 2014-12-01 ENCOUNTER — Ambulatory Visit (INDEPENDENT_AMBULATORY_CARE_PROVIDER_SITE_OTHER): Payer: Medicaid Other | Admitting: *Deleted

## 2014-12-01 ENCOUNTER — Encounter: Payer: Self-pay | Admitting: *Deleted

## 2014-12-01 VITALS — BP 134/65 | HR 105

## 2014-12-01 DIAGNOSIS — O10913 Unspecified pre-existing hypertension complicating pregnancy, third trimester: Secondary | ICD-10-CM

## 2014-12-01 NOTE — Progress Notes (Signed)
NST reviewed and reactive.  

## 2014-12-04 ENCOUNTER — Other Ambulatory Visit: Payer: Medicaid Other

## 2014-12-08 ENCOUNTER — Ambulatory Visit (INDEPENDENT_AMBULATORY_CARE_PROVIDER_SITE_OTHER): Payer: Medicaid Other | Admitting: *Deleted

## 2014-12-08 VITALS — BP 139/58 | HR 85

## 2014-12-08 DIAGNOSIS — O10913 Unspecified pre-existing hypertension complicating pregnancy, third trimester: Secondary | ICD-10-CM | POA: Diagnosis present

## 2014-12-08 NOTE — Progress Notes (Signed)
NST reactive.

## 2014-12-11 ENCOUNTER — Other Ambulatory Visit: Payer: Medicaid Other

## 2014-12-15 ENCOUNTER — Ambulatory Visit (INDEPENDENT_AMBULATORY_CARE_PROVIDER_SITE_OTHER): Payer: Medicaid Other | Admitting: *Deleted

## 2014-12-15 VITALS — BP 143/88 | HR 95

## 2014-12-15 DIAGNOSIS — O10913 Unspecified pre-existing hypertension complicating pregnancy, third trimester: Secondary | ICD-10-CM | POA: Diagnosis not present

## 2014-12-15 DIAGNOSIS — O0992 Supervision of high risk pregnancy, unspecified, second trimester: Secondary | ICD-10-CM

## 2014-12-15 NOTE — Progress Notes (Signed)
NST performed today was reviewed and was found to be reactive.  Patient has missed MD visits; last one on 11/27/14. Had SBP of 143 today, also had SBP of 148 on 7/21 and 156 at 26 weeks; meets criteria for IOL at 39 weeks due to BP >140/90 (Group II on antenatal testing guidelines) Patient prefers to be induced over the weekend due to childcare issues; will follow up for visit and antenatal testing on 12/18/14.   Induction of labor scheduled on 12/20/14 at 0800; patient will be notified as this appointment was made when patient had already left the clinic. Will continue to monitor BP and deliver earlier if indicated.  For now, will continue recommended antenatal testing and prenatal care.

## 2014-12-16 ENCOUNTER — Encounter (HOSPITAL_COMMUNITY): Payer: Self-pay | Admitting: *Deleted

## 2014-12-16 ENCOUNTER — Telehealth (HOSPITAL_COMMUNITY): Payer: Self-pay | Admitting: *Deleted

## 2014-12-16 ENCOUNTER — Telehealth: Payer: Self-pay | Admitting: *Deleted

## 2014-12-16 NOTE — Telephone Encounter (Signed)
Preadmission screen  

## 2014-12-16 NOTE — Telephone Encounter (Signed)
Called pt and advised her of need for IOL this week due to BP elevation yesterday as well as earlier in her pregnancy. I stated that the induction appt has been made for 8/20 @ 0800 due to her childcare situation but if she is able to have IOL prior to that we can schedule an earlier date.  Pt was very excited regarding the information and stated that she would like to keep appt for 8/20 as scheduled. She also stated that she is having some brown vaginal d/c today and UC's which are q59min and strong, then the UC's will stop for an hour or so before returning. Pt advised that it is too soon to come to the hospital for labor evaluation.  Labor sx reviewed. Pt voiced understanding of all information and instructions given.

## 2014-12-17 ENCOUNTER — Inpatient Hospital Stay (HOSPITAL_COMMUNITY): Payer: Medicaid Other | Admitting: Anesthesiology

## 2014-12-17 ENCOUNTER — Inpatient Hospital Stay (HOSPITAL_COMMUNITY)
Admission: AD | Admit: 2014-12-17 | Discharge: 2014-12-20 | DRG: 767 | Disposition: A | Discharge status: Home / Self Care | Payer: Medicaid Other | Source: Ambulatory Visit | Attending: Family Medicine | Admitting: Family Medicine

## 2014-12-17 ENCOUNTER — Encounter (HOSPITAL_COMMUNITY): Payer: Self-pay | Admitting: *Deleted

## 2014-12-17 DIAGNOSIS — Z3A39 39 weeks gestation of pregnancy: Secondary | ICD-10-CM | POA: Diagnosis present

## 2014-12-17 DIAGNOSIS — Z302 Encounter for sterilization: Secondary | ICD-10-CM | POA: Diagnosis not present

## 2014-12-17 DIAGNOSIS — O3421 Maternal care for scar from previous cesarean delivery: Secondary | ICD-10-CM | POA: Diagnosis present

## 2014-12-17 DIAGNOSIS — O34219 Maternal care for unspecified type scar from previous cesarean delivery: Secondary | ICD-10-CM | POA: Diagnosis present

## 2014-12-17 DIAGNOSIS — Z8249 Family history of ischemic heart disease and other diseases of the circulatory system: Secondary | ICD-10-CM

## 2014-12-17 DIAGNOSIS — O1092 Unspecified pre-existing hypertension complicating childbirth: Secondary | ICD-10-CM | POA: Diagnosis present

## 2014-12-17 DIAGNOSIS — O0992 Supervision of high risk pregnancy, unspecified, second trimester: Secondary | ICD-10-CM

## 2014-12-17 DIAGNOSIS — O133 Gestational [pregnancy-induced] hypertension without significant proteinuria, third trimester: Secondary | ICD-10-CM | POA: Diagnosis not present

## 2014-12-17 DIAGNOSIS — O0943 Supervision of pregnancy with grand multiparity, third trimester: Secondary | ICD-10-CM

## 2014-12-17 DIAGNOSIS — Z833 Family history of diabetes mellitus: Secondary | ICD-10-CM

## 2014-12-17 DIAGNOSIS — O10919 Unspecified pre-existing hypertension complicating pregnancy, unspecified trimester: Secondary | ICD-10-CM | POA: Diagnosis present

## 2014-12-17 DIAGNOSIS — O10913 Unspecified pre-existing hypertension complicating pregnancy, third trimester: Secondary | ICD-10-CM

## 2014-12-17 DIAGNOSIS — IMO0001 Reserved for inherently not codable concepts without codable children: Secondary | ICD-10-CM

## 2014-12-17 LAB — COMPREHENSIVE METABOLIC PANEL
ALT: 12 U/L — AB (ref 14–54)
AST: 21 U/L (ref 15–41)
Albumin: 3.1 g/dL — ABNORMAL LOW (ref 3.5–5.0)
Alkaline Phosphatase: 176 U/L — ABNORMAL HIGH (ref 38–126)
Anion gap: 11 (ref 5–15)
BUN: <5 mg/dL — ABNORMAL LOW (ref 6–20)
CHLORIDE: 102 mmol/L (ref 101–111)
CO2: 22 mmol/L (ref 22–32)
Calcium: 9.2 mg/dL (ref 8.9–10.3)
Creatinine, Ser: 0.53 mg/dL (ref 0.44–1.00)
Glucose, Bld: 93 mg/dL (ref 65–99)
POTASSIUM: 3.1 mmol/L — AB (ref 3.5–5.1)
Sodium: 135 mmol/L (ref 135–145)
Total Bilirubin: 0.4 mg/dL (ref 0.3–1.2)
Total Protein: 7.5 g/dL (ref 6.5–8.1)

## 2014-12-17 LAB — TYPE AND SCREEN
ABO/RH(D): O POS
ANTIBODY SCREEN: NEGATIVE

## 2014-12-17 LAB — URIC ACID: URIC ACID, SERUM: 4.6 mg/dL (ref 2.3–6.6)

## 2014-12-17 LAB — PROTEIN / CREATININE RATIO, URINE: CREATININE, URINE: 36 mg/dL

## 2014-12-17 LAB — CBC
HCT: 30.4 % — ABNORMAL LOW (ref 36.0–46.0)
Hemoglobin: 9.4 g/dL — ABNORMAL LOW (ref 12.0–15.0)
MCH: 23 pg — AB (ref 26.0–34.0)
MCHC: 30.9 g/dL (ref 30.0–36.0)
MCV: 74.3 fL — ABNORMAL LOW (ref 78.0–100.0)
PLATELETS: 202 10*3/uL (ref 150–400)
RBC: 4.09 MIL/uL (ref 3.87–5.11)
RDW: 16.9 % — ABNORMAL HIGH (ref 11.5–15.5)
WBC: 9.3 10*3/uL (ref 4.0–10.5)

## 2014-12-17 LAB — LACTATE DEHYDROGENASE: LDH: 114 U/L (ref 98–192)

## 2014-12-17 MED ORDER — DIPHENHYDRAMINE HCL 50 MG/ML IJ SOLN: 12.5000 mg | INTRAMUSCULAR | Status: DC | PRN

## 2014-12-17 MED ORDER — OXYTOCIN 40 UNITS IN LACTATED RINGERS INFUSION - SIMPLE MED
62.5000 mL/h | INTRAVENOUS | Status: DC
  Filled 2014-12-17: qty 1000

## 2014-12-17 MED ORDER — LIDOCAINE HCL (PF) 1 % IJ SOLN
30.0000 mL | INTRAMUSCULAR | Status: DC | PRN
  Filled 2014-12-17: qty 30

## 2014-12-17 MED ORDER — LACTATED RINGERS IV SOLN
INTRAVENOUS | Status: DC
  Administered 2014-12-18: 12:00:00 via INTRAVENOUS

## 2014-12-17 MED ORDER — EPHEDRINE 5 MG/ML INJ: 10.0000 mg | INTRAVENOUS | Status: DC | PRN

## 2014-12-17 MED ORDER — OXYCODONE-ACETAMINOPHEN 5-325 MG PO TABS: 2.0000 | ORAL_TABLET | ORAL | Status: DC | PRN

## 2014-12-17 MED ORDER — FENTANYL 2.5 MCG/ML BUPIVACAINE 1/10 % EPIDURAL INFUSION (WH - ANES)
14.0000 mL/h | INTRAMUSCULAR | Status: DC | PRN
  Administered 2014-12-17: 14 mL/h via EPIDURAL
  Administered 2014-12-17: 12 mL/h via EPIDURAL
  Administered 2014-12-18: 14 mL/h via EPIDURAL
  Filled 2014-12-17 (×2): qty 125

## 2014-12-17 MED ORDER — OXYTOCIN BOLUS FROM INFUSION
500.0000 mL | INTRAVENOUS | Status: DC
  Administered 2014-12-18: 500 mL via INTRAVENOUS

## 2014-12-17 MED ORDER — FENTANYL CITRATE (PF) 100 MCG/2ML IJ SOLN: 100.0000 ug | INTRAMUSCULAR | Status: DC | PRN

## 2014-12-17 MED ORDER — ONDANSETRON HCL 4 MG/2ML IJ SOLN: 4.0000 mg | Freq: Four times a day (QID) | INTRAMUSCULAR | Status: DC | PRN

## 2014-12-17 MED ORDER — OXYCODONE-ACETAMINOPHEN 5-325 MG PO TABS: 1.0000 | ORAL_TABLET | ORAL | Status: DC | PRN

## 2014-12-17 MED ORDER — CITRIC ACID-SODIUM CITRATE 334-500 MG/5ML PO SOLN
30.0000 mL | ORAL | Status: DC | PRN
  Administered 2014-12-18: 30 mL via ORAL
  Filled 2014-12-17: qty 15

## 2014-12-17 MED ORDER — ACETAMINOPHEN 325 MG PO TABS: 650.0000 mg | ORAL_TABLET | ORAL | Status: DC | PRN

## 2014-12-17 MED ORDER — PHENYLEPHRINE 40 MCG/ML (10ML) SYRINGE FOR IV PUSH (FOR BLOOD PRESSURE SUPPORT)
80.0000 ug | PREFILLED_SYRINGE | INTRAVENOUS | Status: DC | PRN
  Filled 2014-12-17: qty 20

## 2014-12-17 MED ORDER — LACTATED RINGERS IV SOLN
500.0000 mL | INTRAVENOUS | Status: DC | PRN
  Administered 2014-12-17: 500 mL via INTRAVENOUS

## 2014-12-17 NOTE — MAU Note (Addendum)
C/o ucs since Tuesday morning- ucs got stronger around 0300 this AM; ? SROM -has had some trickling; scheduled induction for this Sat;

## 2014-12-17 NOTE — MAU Provider Note (Signed)
  History     CSN: [id number]  Arrival date and time: 12/17/14 1811   None     Chief Complaint  Patient presents with  . Labor Eval   HPI  Patient is 29 y.o. [id number] [redacted]w[redacted]d here for labor evaluation.  [name] reports that she is having severe pain attributed to contractions. She first experienced contractions yesterday. They are still irregular. She has also been seeing spots today, especially when standing up. She also reports chronic headaches, though says they are not worse today than usual. She has had a history of HTN throughout her pregnancy, but is not taking any medications. She also reports that her urine has been very foamy recently. She denies RUQ pain. Endorses intermittent vaginal bleeding when wiping. She also endorses what she describes as trickling of fluid, but no gush.   Of note, she is scheduled for induction in two days due to BP >140/90.    Past Medical History  Diagnosis Date  . Gestational hypertension     Past Surgical History  Procedure Laterality Date  . Cesarean section    . Mouth surgery      Family History  Problem Relation Age of Onset  . Diabetes Maternal Grandmother   . Kidney disease Maternal Grandmother   . Heart disease Mother     Social History  Substance Use Topics  . Smoking status: Never Smoker   . Smokeless tobacco: Never Used  . Alcohol Use: No    Allergies: No Known Allergies  Prescriptions prior to admission  Medication Sig Dispense Refill Last Dose  . aspirin 81 MG tablet Take 81 mg by mouth daily.   Past Week at Unknown time  . HYDROcodone-acetaminophen (NORCO/VICODIN) 5-325 MG per tablet Take 1 tablet by mouth every 6 (six) hours as needed for moderate pain or severe pain.   12/17/2014 at Unknown time  . Prenatal Vit-Fe Fumarate-FA (PRENATAL VITAMINS) 28-0.8 MG TABS Take 1 tablet by mouth daily. 30 tablet 5 Past Week at Unknown time  . Elastic Bandages & Supports (MEDICAL COMPRESSION PANTYHOSE) MISC Pregnancy  compression pantyhose, 15-20 compression 1 each 0 11/25/2014 at Unknown time    ROS Physical Exam   Blood pressure 140/87, pulse 94, temperature 98.5 F (36.9 C), temperature source Oral, resp. rate 18, last menstrual period 03/23/2014.  Physical Exam  MAU Course  Procedures   Assessment and Plan   Care transferred to [name].   [name] 12/17/2014, 7:26 PM

## 2014-12-17 NOTE — H&P (Signed)
Brittany Flores is a 29 y.o. female G4 P1021 @ 39.1wks  presenting for contractions. Her BP was elevated in triage and she complained of seeing spots whenever she stood up from sitting down. PIH labs and urine testing were performed and were normal. Prior to discharge from [facility], her contraction pattern had increased and her cervical exam was 4/70/-1. Patient elects to TOLAC at this time. IOL was scheduled for 8/20 (delayed to then per pt request).   Maternal Medical History:  Reason for admission: Contractions.   Contractions: Onset was 13-24 hours ago.   Frequency: irregular.   Perceived severity is moderate.    Fetal activity: Perceived fetal activity is normal.      OB History    Gravida Para Term Preterm AB TAB SAB Ectopic Multiple Living   Past Medical History  Diagnosis Date  . Gestational hypertension    Past Surgical History  Procedure Laterality Date  . Cesarean section    . Mouth surgery     Family History: family history includes Diabetes in her maternal grandmother; Heart disease in her mother; Kidney disease in her maternal grandmother. Social History:  reports that she has never smoked. She has never used smokeless tobacco. She reports that she uses illicit drugs (Marijuana). She reports that she does not drink alcohol.   Prenatal Transfer Tool  Maternal Diabetes: No Genetic Screening: Normal Maternal Ultrasounds/Referrals: Normal Fetal Ultrasounds or other Referrals:  None Maternal Substance Abuse:  No Significant Maternal Medications:  None Significant Maternal Lab Results:  None Other Comments:   Patient has chronic headaches but does not take anything for them. Preexisting hypertension but not on medication.  Had primary C-section for Breech.   Review of Systems  Constitutional: Negative.   Eyes: Negative.   Respiratory: Negative.   Cardiovascular: Negative.   Gastrointestinal: Negative.   Genitourinary: Negative.    Musculoskeletal: Negative.   Skin: Negative.   Neurological: Positive for headaches.  Endo/Heme/Allergies: Negative.   Psychiatric/Behavioral: Positive for substance abuse.  Patient reports that she uses Marijuana.   Dilation: 4 Effacement (%): 80 Station: -1 Exam by:: K Lania Zawistowski CNM Blood pressure 141/61, pulse 86, temperature 98.5 F (36.9 C), temperature source Oral, resp. rate 18, last menstrual period 03/23/2014. Maternal Exam:  Uterine Assessment: Contraction strength is moderate.  Contraction frequency is irregular.   Abdomen: Patient reports no abdominal tenderness. Fetal presentation: vertex  Introitus: Normal vulva. Normal vagina.  Cervix: Cervix evaluated by digital exam.     Physical Exam  Constitutional: She appears well-developed.  HENT:  Right Ear: External ear normal.  Eyes: Pupils are equal, round, and reactive to light.  Neck: Normal range of motion.  Cardiovascular: Normal rate.   Respiratory: Effort normal.  GI: Soft.  Genitourinary: Vagina normal.  Musculoskeletal: Normal range of motion.  Neurological: She is alert.  Skin: Skin is warm.  Psychiatric: She has a normal mood and affect.    CBC    Component Value Date/Time   WBC 9.3 12/17/2014 1930   RBC 4.09 12/17/2014 1930   HGB 9.4* 12/17/2014 1930   HCT 30.4* 12/17/2014 1930   PLT 202 12/17/2014 1930   MCV 74.3* 12/17/2014 1930   MCH 23.0* 12/17/2014 1930   MCHC 30.9 12/17/2014 1930   RDW 16.9* 12/17/2014 1930   LYMPHSABS 1.3 07/03/2014 1051   MONOABS 0.6 07/03/2014 1051   EOSABS 0.1 07/03/2014 1051   BASOSABS 0.0 07/03/2014 1051  CMP     Component Value Date/Time   NA 135 12/17/2014 1930   K 3.1* 12/17/2014 1930   CL 102 12/17/2014 1930   CO2 22 12/17/2014 1930   GLUCOSE 93 12/17/2014 1930   BUN <5* 12/17/2014 1930   CREATININE 0.53 12/17/2014 1930   CREATININE 0.58 07/03/2014 1139   CALCIUM 9.2 12/17/2014 1930   PROT 7.5 12/17/2014 1930   ALBUMIN 3.1* 12/17/2014 1930   AST 21  12/17/2014 1930   ALT 12* 12/17/2014 1930   ALKPHOS 176* 12/17/2014 1930   BILITOT 0.4 12/17/2014 1930   GFRNONAA >60 12/17/2014 1930   GFRAA >60 12/17/2014 1930   P/C ratio: nl  Prenatal labs: ABO, Rh: O/POS/-- (03/03 1051) Antibody: NEG (03/03 1051) Rubella: 2.61 (03/03 1051) RPR: NON REAC (05/19 1205)  HBsAg: NEGATIVE (03/03 1051)  HIV: NONREACTIVE (05/19 1205)  GBS: Negative (07/28 0000)   Assessment/Plan: G4 P1021 in early labor; has made cervical changes in the past two hours. Desires TOLAC.  cHTN w/ stable BPs -Admit for TOLAC -anticipate NSVD  Marylene Land 12/17/2014, 9:22 PM  CNM attestation:   Brittany Flores is a 29 y.o. [phone] here for early labor  PE: BP 151/80 mmHg  Pulse 107  Temp(Src) 98.5 F (36.9 C) (Oral)  Resp 18  Ht 5' 9.5" (1.765 m)  Wt 91.627 kg (202 lb)  BMI 29.41 kg/m2  SpO2 100%  LMP 03/23/2014 (Exact Date) Gen: calm comfortable, NAD Resp: normal effort, no distress Abd: gravid  ROS, labs, PMH reviewed  Plan: Admit to [employer] [facility] management (pt elects VBAC) Watch BPs Plan BTL PP per pt request  Dmoni Fortson 12/18/2014, 1:01 AM

## 2014-12-17 NOTE — Progress Notes (Signed)
S:   Patient coping well; happy with epidural.   Denies HA, epigastric pain, SOB or blurred vision.  Desires to continue with TOLAC.     O:  VS: Blood pressure 155/79, pulse 99, temperature 98.5 F (36.9 C), temperature source Oral, resp. rate 18, height 5' 9.5" (1.765 m), weight 91.627 kg (202 lb), last menstrual period 03/23/2014, SpO2 100 %.        FHR : baseline 125 / variability moderate / accelerations yes  / no  decelerations        Toco: contractions every 4-5 minutes         Cervix : exam deferred        Membranes: intact  A:  G2 P1021 in early labor. Elevated systolic pressures but no other signs and symptoms of preeclampsia.   P: -continue monitoring BP and signs and symptoms of preeclampsia Anticipate NSVD   Charlesetta Garibaldi Kooistra SNM 12/17/2014, 11:52 PM   I have seen and examined this patient and I agree with the above. Cam Hai CNM 1:37 AM 12/18/2014

## 2014-12-18 ENCOUNTER — Inpatient Hospital Stay (HOSPITAL_COMMUNITY): Payer: Medicaid Other | Admitting: Anesthesiology

## 2014-12-18 ENCOUNTER — Other Ambulatory Visit: Payer: Medicaid Other

## 2014-12-18 ENCOUNTER — Encounter (HOSPITAL_COMMUNITY)
Admission: AD | Disposition: A | Discharge status: Home / Self Care | Payer: Self-pay | Source: Ambulatory Visit | Attending: Family Medicine

## 2014-12-18 ENCOUNTER — Encounter (HOSPITAL_COMMUNITY): Payer: Self-pay | Admitting: *Deleted

## 2014-12-18 DIAGNOSIS — Z3A39 39 weeks gestation of pregnancy: Secondary | ICD-10-CM

## 2014-12-18 DIAGNOSIS — O133 Gestational [pregnancy-induced] hypertension without significant proteinuria, third trimester: Secondary | ICD-10-CM

## 2014-12-18 DIAGNOSIS — Z302 Encounter for sterilization: Secondary | ICD-10-CM

## 2014-12-18 HISTORY — PX: TUBAL LIGATION: SHX77

## 2014-12-18 LAB — CBC
HEMATOCRIT: 26.5 % — AB (ref 36.0–46.0)
HEMATOCRIT: 24.6 % — AB (ref 36.0–46.0)
HEMOGLOBIN: 8 g/dL — AB (ref 12.0–15.0)
Hemoglobin: 7.6 g/dL — ABNORMAL LOW (ref 12.0–15.0)
MCH: 22.8 pg — AB (ref 26.0–34.0)
MCH: 23 pg — AB (ref 26.0–34.0)
MCHC: 30.2 g/dL (ref 30.0–36.0)
MCHC: 30.9 g/dL (ref 30.0–36.0)
MCV: 75.5 fL — AB (ref 78.0–100.0)
MCV: 74.3 fL — AB (ref 78.0–100.0)
PLATELETS: 194 10*3/uL (ref 150–400)
Platelets: 204 10*3/uL (ref 150–400)
RBC: 3.51 MIL/uL — AB (ref 3.87–5.11)
RBC: 3.31 MIL/uL — ABNORMAL LOW (ref 3.87–5.11)
RDW: 17.1 % — ABNORMAL HIGH (ref 11.5–15.5)
RDW: 17.1 % — AB (ref 11.5–15.5)
WBC: 19.4 10*3/uL — ABNORMAL HIGH (ref 4.0–10.5)
WBC: 18.9 10*3/uL — ABNORMAL HIGH (ref 4.0–10.5)

## 2014-12-18 LAB — RPR: RPR Ser Ql: NONREACTIVE

## 2014-12-18 LAB — ABO/RH: ABO/RH(D): O POS

## 2014-12-18 SURGERY — LIGATION, FALLOPIAN TUBE, POSTPARTUM
Anesthesia: Epidural | Site: Abdomen

## 2014-12-18 MED ORDER — CARBOPROST TROMETHAMINE 250 MCG/ML IM SOLN: 250.0000 ug | Freq: Once | INTRAMUSCULAR | Status: DC

## 2014-12-18 MED ORDER — ONDANSETRON HCL 4 MG/2ML IJ SOLN
INTRAMUSCULAR | Status: AC
  Filled 2014-12-18: qty 2

## 2014-12-18 MED ORDER — BENZOCAINE-MENTHOL 20-0.5 % EX AERO
1.0000 "application " | INHALATION_SPRAY | CUTANEOUS | Status: DC | PRN
  Administered 2014-12-18: 1 via TOPICAL
  Filled 2014-12-18: qty 56

## 2014-12-18 MED ORDER — BUPIVACAINE HCL (PF) 0.25 % IJ SOLN
INTRAMUSCULAR | Status: DC | PRN
  Administered 2014-12-18: 6 mL

## 2014-12-18 MED ORDER — MISOPROSTOL 200 MCG PO TABS
1000.0000 ug | ORAL_TABLET | Freq: Once | ORAL | Status: AC
Start: 2014-12-18 — End: 2014-12-18
  Administered 2014-12-18: 1000 ug via RECTAL

## 2014-12-18 MED ORDER — ONDANSETRON HCL 4 MG/2ML IJ SOLN: 4.0000 mg | INTRAMUSCULAR | Status: DC | PRN

## 2014-12-18 MED ORDER — ZOLPIDEM TARTRATE 5 MG PO TABS: 5.0000 mg | ORAL_TABLET | Freq: Every evening | ORAL | Status: DC | PRN

## 2014-12-18 MED ORDER — TERBUTALINE SULFATE 1 MG/ML IJ SOLN: 0.2500 mg | Freq: Once | INTRAMUSCULAR | Status: DC | PRN

## 2014-12-18 MED ORDER — SIMETHICONE 80 MG PO CHEW
80.0000 mg | CHEWABLE_TABLET | ORAL | Status: DC | PRN
  Administered 2014-12-19: 80 mg via ORAL
  Filled 2014-12-18: qty 1

## 2014-12-18 MED ORDER — FENTANYL CITRATE (PF) 100 MCG/2ML IJ SOLN: 25.0000 ug | INTRAMUSCULAR | Status: DC | PRN

## 2014-12-18 MED ORDER — PRENATAL MULTIVITAMIN CH
1.0000 | ORAL_TABLET | Freq: Every day | ORAL | Status: DC
  Administered 2014-12-19: 1 via ORAL
  Filled 2014-12-18 (×3): qty 1

## 2014-12-18 MED ORDER — WITCH HAZEL-GLYCERIN EX PADS
1.0000 "application " | MEDICATED_PAD | CUTANEOUS | Status: DC | PRN
  Administered 2014-12-18: 1 via TOPICAL

## 2014-12-18 MED ORDER — OXYTOCIN 40 UNITS IN LACTATED RINGERS INFUSION - SIMPLE MED: 1.0000 m[IU]/min | INTRAVENOUS | Status: DC

## 2014-12-18 MED ORDER — OXYTOCIN 40 UNITS IN LACTATED RINGERS INFUSION - SIMPLE MED: 62.5000 mL/h | INTRAVENOUS | Status: DC | PRN

## 2014-12-18 MED ORDER — FENTANYL CITRATE (PF) 100 MCG/2ML IJ SOLN
INTRAMUSCULAR | Status: AC
  Filled 2014-12-18: qty 4

## 2014-12-18 MED ORDER — SODIUM BICARBONATE 8.4 % IV SOLN
INTRAVENOUS | Status: AC
  Filled 2014-12-18: qty 50

## 2014-12-18 MED ORDER — SODIUM BICARBONATE 8.4 % IV SOLN
INTRAVENOUS | Status: DC | PRN
  Administered 2014-12-18 (×2): 5 mL via EPIDURAL
  Administered 2014-12-18: 3 mL via EPIDURAL
  Administered 2014-12-18: 2 mL via EPIDURAL
  Administered 2014-12-18: 3 mL via EPIDURAL

## 2014-12-18 MED ORDER — ACETAMINOPHEN 325 MG PO TABS
650.0000 mg | ORAL_TABLET | ORAL | Status: DC | PRN
  Administered 2014-12-18 (×2): 650 mg via ORAL
  Filled 2014-12-18 (×2): qty 2

## 2014-12-18 MED ORDER — TETANUS-DIPHTH-ACELL PERTUSSIS 5-2.5-18.5 LF-MCG/0.5 IM SUSP: 0.5000 mL | Freq: Once | INTRAMUSCULAR | Status: DC

## 2014-12-18 MED ORDER — KETOROLAC TROMETHAMINE 30 MG/ML IJ SOLN
INTRAMUSCULAR | Status: DC | PRN
  Administered 2014-12-18: 30 mg via INTRAVENOUS

## 2014-12-18 MED ORDER — DIPHENOXYLATE-ATROPINE 2.5-0.025 MG PO TABS: 1.0000 | ORAL_TABLET | Freq: Once | ORAL | Status: DC

## 2014-12-18 MED ORDER — MEPERIDINE HCL 25 MG/ML IJ SOLN
INTRAMUSCULAR | Status: AC
  Filled 2014-12-18: qty 1

## 2014-12-18 MED ORDER — SENNOSIDES-DOCUSATE SODIUM 8.6-50 MG PO TABS
2.0000 | ORAL_TABLET | ORAL | Status: DC
  Administered 2014-12-18: 2 via ORAL
  Filled 2014-12-18: qty 2

## 2014-12-18 MED ORDER — MISOPROSTOL 200 MCG PO TABS
ORAL_TABLET | ORAL | Status: AC
Start: 2014-12-18 — End: 2014-12-18
  Administered 2014-12-18: 1000 ug via RECTAL
  Filled 2014-12-18: qty 5

## 2014-12-18 MED ORDER — ONDANSETRON HCL 4 MG/2ML IJ SOLN
INTRAMUSCULAR | Status: DC | PRN
  Administered 2014-12-18: 4 mg via INTRAVENOUS

## 2014-12-18 MED ORDER — MEASLES, MUMPS & RUBELLA VAC ~~LOC~~ INJ
0.5000 mL | INJECTION | Freq: Once | SUBCUTANEOUS | Status: DC
  Filled 2014-12-18: qty 0.5

## 2014-12-18 MED ORDER — CARBOPROST TROMETHAMINE 250 MCG/ML IM SOLN
INTRAMUSCULAR | Status: AC
  Filled 2014-12-18: qty 1

## 2014-12-18 MED ORDER — DIBUCAINE 1 % RE OINT
1.0000 "application " | TOPICAL_OINTMENT | RECTAL | Status: DC | PRN
  Administered 2014-12-18: 1 via RECTAL
  Filled 2014-12-18: qty 28

## 2014-12-18 MED ORDER — OXYCODONE-ACETAMINOPHEN 5-325 MG PO TABS
2.0000 | ORAL_TABLET | ORAL | Status: DC | PRN
  Filled 2014-12-18: qty 2

## 2014-12-18 MED ORDER — MIDAZOLAM HCL 2 MG/2ML IJ SOLN
INTRAMUSCULAR | Status: DC | PRN
  Administered 2014-12-18 (×2): 1 mg via INTRAVENOUS

## 2014-12-18 MED ORDER — ONDANSETRON HCL 4 MG PO TABS: 4.0000 mg | ORAL_TABLET | ORAL | Status: DC | PRN

## 2014-12-18 MED ORDER — BUPIVACAINE HCL (PF) 0.25 % IJ SOLN
INTRAMUSCULAR | Status: DC | PRN
  Administered 2014-12-17 (×2): 4 mL

## 2014-12-18 MED ORDER — KETOROLAC TROMETHAMINE 30 MG/ML IJ SOLN
INTRAMUSCULAR | Status: AC
  Filled 2014-12-18: qty 1

## 2014-12-18 MED ORDER — LANOLIN HYDROUS EX OINT: TOPICAL_OINTMENT | CUTANEOUS | Status: DC | PRN

## 2014-12-18 MED ORDER — MEPERIDINE HCL 25 MG/ML IJ SOLN
INTRAMUSCULAR | Status: DC | PRN
  Administered 2014-12-18: 12.5 mg via INTRAVENOUS

## 2014-12-18 MED ORDER — DIPHENHYDRAMINE HCL 25 MG PO CAPS: 25.0000 mg | ORAL_CAPSULE | Freq: Four times a day (QID) | ORAL | Status: DC | PRN

## 2014-12-18 MED ORDER — OXYCODONE-ACETAMINOPHEN 5-325 MG PO TABS
1.0000 | ORAL_TABLET | ORAL | Status: DC | PRN
  Administered 2014-12-19 – 2014-12-20 (×4): 1 via ORAL
  Filled 2014-12-18 (×3): qty 1

## 2014-12-18 MED ORDER — MIDAZOLAM HCL 2 MG/2ML IJ SOLN
INTRAMUSCULAR | Status: AC
  Filled 2014-12-18: qty 4

## 2014-12-18 MED ORDER — LIDOCAINE-EPINEPHRINE (PF) 2 %-1:200000 IJ SOLN
INTRAMUSCULAR | Status: DC | PRN
  Administered 2014-12-17: 4 mL

## 2014-12-18 MED ORDER — LIDOCAINE-EPINEPHRINE (PF) 2 %-1:200000 IJ SOLN
INTRAMUSCULAR | Status: AC
  Filled 2014-12-18: qty 20

## 2014-12-18 MED ORDER — BUPIVACAINE HCL (PF) 0.25 % IJ SOLN
INTRAMUSCULAR | Status: AC
  Filled 2014-12-18: qty 30

## 2014-12-18 SURGICAL SUPPLY — 20 items
CHLORAPREP W/TINT 26ML (MISCELLANEOUS) ×3 IMPLANT
CLIP FILSHIE TUBAL LIGA STRL (Clip) ×3 IMPLANT
CLOTH BEACON ORANGE TIMEOUT ST (SAFETY) ×3 IMPLANT
DRSG OPSITE POSTOP 3X4 (GAUZE/BANDAGES/DRESSINGS) ×3 IMPLANT
GLOVE BIOGEL PI IND STRL 7.0 (GLOVE) ×1 IMPLANT
GLOVE BIOGEL PI INDICATOR 7.0 (GLOVE) ×2
GLOVE ECLIPSE 7.0 STRL STRAW (GLOVE) ×6 IMPLANT
GOWN STRL REUS W/TWL LRG LVL3 (GOWN DISPOSABLE) ×6 IMPLANT
LIQUID BAND (GAUZE/BANDAGES/DRESSINGS) ×3 IMPLANT
NEEDLE HYPO 22GX1.5 SAFETY (NEEDLE) ×3 IMPLANT
NS IRRIG 1000ML POUR BTL (IV SOLUTION) ×3 IMPLANT
PACK ABDOMINAL MINOR (CUSTOM PROCEDURE TRAY) ×3 IMPLANT
SPONGE LAP 4X18 X RAY DECT (DISPOSABLE) IMPLANT
SUT VIC AB 0 CT1 27 (SUTURE) ×2
SUT VIC AB 0 CT1 27XBRD ANBCTR (SUTURE) ×1 IMPLANT
SUT VICRYL 4-0 PS2 18IN ABS (SUTURE) ×3 IMPLANT
SYR CONTROL 10ML LL (SYRINGE) ×3 IMPLANT
TOWEL OR 17X24 6PK STRL BLUE (TOWEL DISPOSABLE) ×6 IMPLANT
TRAY FOLEY CATH SILVER 14FR (SET/KITS/TRAYS/PACK) ×3 IMPLANT
WATER STERILE IRR 1000ML POUR (IV SOLUTION) ×3 IMPLANT

## 2014-12-18 NOTE — Op Note (Signed)
Brittany Flores  12/17/2014 - 12/18/2014  PREOPERATIVE DIAGNOSIS:  Multiparity, undesired fertility  POSTOPERATIVE DIAGNOSIS:  Multiparity, undesired fertility  PROCEDURE:  Postpartum Bilateral Tubal Sterilization using Filshie Clips   ANESTHESIA:  Epidural and local analgesia using 0.25% Marcaine  COMPLICATIONS:  None immediate.  ESTIMATED BLOOD LOSS: 5 ml.  INDICATIONS: 29 y.o. [id number]  with undesired fertility,status post vaginal delivery, desires permanent sterilization.  Other reversible forms of contraception were discussed with patient; she declines all other modalities. Risks of procedure discussed with patient including but not limited to: risk of regret, permanence of method, bleeding, infection, injury to surrounding organs and need for additional procedures.  Failure risk of 0.5-1% with increased risk of ectopic gestation if pregnancy occurs was also discussed with patient.     FINDINGS:  Normal uterus, tubes, and ovaries.  PROCEDURE DETAILS: The patient was taken to the operating room where her spinal anesthesia was dosed up to surgical level and found to be adequate.  She was then placed in a supine position and prepped and draped in the usual sterile fashion.  After an adequate timeout was performed, attention was turned to the patient's abdomen where a small transverse skin incision was made under the umbilical fold. The incision was taken down to the layer of fascia using the scalpel, and fascia was incised, and extended bilaterally. The peritoneum was entered in a sharp fashion. The patient was placed in Trendelenburg.  The left fallopian tube was identified and grasped with a Babcock clamp, and followed out to the fimbriated end.  A Filshie clip was placed on the left fallopian tube about 2 cm from the cornu.  A similar process was carried out on the right side allowing for bilateral tubal sterilization.  Good hemostasis was noted overall.  Local analgesia was injected into both  Filshie application sites.The instruments were then removed from the patient's abdomen and the fascial incision was repaired with 0 Vicryl, and the skin was closed with a 4-0 Vicryl subcuticular stitch. The patient tolerated the procedure well.  Sponge, lap, and needle counts were correct times two.  The patient was then taken to the recovery room awake, extubated and in stable condition.  Silvano Bilis MD 12/18/2014 12:46 PM

## 2014-12-18 NOTE — Progress Notes (Signed)
Notified CNM that Pt was 10 cm, Pt still not feeling the urge to push , CNM states- it's fine to let the Pt labor down if she doesn't have the urge to push.

## 2014-12-18 NOTE — Lactation Note (Signed)
This note was copied from the chart of Brittany Flores. Lactation Consultation Note Initial visit at 10 hours of age.  MOm reports a few good feedings, but unsure of herself.  MOm has easily expressed colostrum with WNL nipples and breast tissue.  Baby asleep with visitor, last feeding about 1 hour ago. MOm denies pain with latch.   [facility] resources given and discussed.  Encouraged to feed with early cues on demand.  Early newborn behavior discussed.  Mom to call for assist as needed.    Patient Name: Brittany Flores Today's Date: 12/18/2014 Reason for consult: Initial assessment   Maternal Data Has patient been taught Hand Expression?: Yes  Feeding    LATCH Score/Interventions                Intervention(s): Breastfeeding basics reviewed     Lactation Tools Discussed/Used WIC Program: Yes   Consult Status Consult Status: Follow-up Date: 12/19/14 Follow-up type: In-patient    Beverely Risen Arvella Merles 12/18/2014, 8:55 PM

## 2014-12-18 NOTE — Progress Notes (Signed)
S:   Patient coping well with epidural. Denies headache, blurry vision, floating spots, or epigastric pain. Desires to continue with TOLAC  O:  VS: Blood pressure 147/92, pulse 83, temperature 98.5 F (36.9 C), temperature source Oral, resp. rate 20, height 5' 9.5" (1.765 m), weight 91.627 kg (202 lb), last menstrual period 03/23/2014, SpO2 100 %.        FHR : baseline 120 / variability moderate / accelerations present / no decelerations        Toco: contractions every 2-4 minutes         Cervix : 9/90/-1        Membranes: AROM at 0515 clear fluid  A:  Active labor, progressing well.   P: Anticipate NSVD   Charlesetta Garibaldi Kooistra SNM 12/18/2014, 5:29 AM   I have seen and examined this patient and I agree with the above. Recent BPs 134/72, 146/90. Cam Hai CNM 7:09 AM 12/18/2014

## 2014-12-18 NOTE — Interval H&P Note (Signed)
History and Physical Interval Note:  12/18/2014 11:28 AM  Brittany Flores  has presented today for surgery, with the diagnosis of desired sterilization, now s/p VBAC.  The various methods of treatment have been discussed with the patient and family. After consideration of risks, benefits and other options for treatment, the patient has consented to  Procedure(s): POST PARTUM TUBAL LIGATION (N/A) as a surgical intervention .  The patient's history has been reviewed, patient examined, no change in status, stable for surgery.  I have reviewed the patient's chart and labs.  Questions were answered to the patient's satisfaction.     Anil Havard S

## 2014-12-18 NOTE — Anesthesia Postprocedure Evaluation (Signed)
Anesthesia Post Note  Patient: Brittany Flores  Procedure(s) Performed: Procedure(s) (LRB): POST PARTUM TUBAL LIGATION (N/A)  Anesthesia type: Epidural  Patient location: Mother/Baby  Post pain: Pain level controlled  Post assessment: Post-op Vital signs reviewed  Last Vitals:  Filed Vitals:   12/18/14 1526  BP: 138/63  Pulse: 85  Temp: 36.8 C  Resp: 20    Post vital signs: Reviewed  Level of consciousness:alert  Complications: No apparent anesthesia complications

## 2014-12-18 NOTE — Anesthesia Postprocedure Evaluation (Signed)
Anesthesia Post Note  Patient: Brittany Flores  Procedure(s) Performed: * No procedures listed *  Anesthesia type: Epidural  Patient location: Mother/Baby  Post pain: Pain level controlled  Post assessment: Post-op Vital signs reviewed  Last Vitals:  Filed Vitals:   12/18/14 1526  BP: 138/63  Pulse: 85  Temp: 36.8 C  Resp: 20    Post vital signs: Reviewed  Level of consciousness:alert  Complications: No apparent anesthesia complications

## 2014-12-18 NOTE — Addendum Note (Signed)
Addendum  created 12/18/14 1641 by Graciela Husbands, CRNA   Modules edited: Charges VN, Notes Section   Notes Section:  File: [id number]

## 2014-12-18 NOTE — Anesthesia Preprocedure Evaluation (Signed)
Anesthesia Evaluation  Patient identified by MRN, date of birth, ID band Patient awake    Reviewed: Allergy & Precautions, Patient's Chart, lab work & pertinent test results  History of Anesthesia Complications Negative for: history of anesthetic complications  Airway Mallampati: II  TM Distance: >3 FB Neck ROM: Full    Dental  (+) Teeth Intact   Pulmonary neg pulmonary ROS,  breath sounds clear to auscultation        Cardiovascular hypertension, Rhythm:Regular     Neuro/Psych negative neurological ROS  negative psych ROS   GI/Hepatic negative GI ROS, Neg liver ROS,   Endo/Other  negative endocrine ROS  Renal/GU negative Renal ROS     Musculoskeletal   Abdominal   Peds  Hematology negative hematology ROS (+)   Anesthesia Other Findings   Reproductive/Obstetrics (+) Pregnancy                             Anesthesia Physical Anesthesia Plan  ASA: II  Anesthesia Plan: Epidural   Post-op Pain Management:    Induction:   Airway Management Planned:   Additional Equipment:   Intra-op Plan:   Post-operative Plan:   Informed Consent: I have reviewed the patients History and Physical, chart, labs and discussed the procedure including the risks, benefits and alternatives for the proposed anesthesia with the patient or authorized representative who has indicated his/her understanding and acceptance.   Dental advisory given  Plan Discussed with: Anesthesiologist  Anesthesia Plan Comments:         Anesthesia Quick Evaluation

## 2014-12-18 NOTE — Addendum Note (Signed)
Addendum  created 12/18/14 1641 by Graciela Husbands, CRNA   Modules edited: Notes Section   Notes Section:  File: [id number]

## 2014-12-18 NOTE — Progress Notes (Signed)
Dr Shawnie Pons discussed risks and benefits of vacuum extraction.  Pt verbalized understanding and gave verbal consent.

## 2014-12-18 NOTE — Progress Notes (Signed)
S:   Patient coping well with epidural; denies SOB, HA, epigastric pain or blurry vision and spots. Desires to continue with TOLAC  O:  VS: Blood pressure 137/85, pulse 98, temperature 98.5 F (36.9 C), temperature source Oral, resp. rate 18, height 5' 9.5" (1.765 m), weight 91.627 kg (202 lb), last menstrual period 03/23/2014, SpO2 100 %.        FHR : baseline 120 / moderate variability / accelerations present  / no  decelerations        Toco: contractions irregular         Cervix : 7-8 cm/90/ -1        Membranes: Intact  A:  Active labor, progressing well without augmentation       P:  Anticipate NSVD   Charlesetta Garibaldi Kooistra SNM 12/18/2014, 3:02 AM   I have seen and examined this patient and I agree with the above. Cam Hai CNM 7:17 AM 12/18/2014

## 2014-12-18 NOTE — Transfer of Care (Signed)
Immediate Anesthesia Transfer of Care Note  Patient: Brittany Flores  Procedure(s) Performed: Procedure(s): POST PARTUM TUBAL LIGATION (N/A)  Patient Location: PACU  Anesthesia Type:Epidural  Level of Consciousness: awake, alert , oriented and patient cooperative  Airway & Oxygen Therapy: Patient Spontanous Breathing  Post-op Assessment: Report given to RN and Post -op Vital signs reviewed and stable  Post vital signs: Reviewed and stable  Last Vitals:  Filed Vitals:   12/18/14 1146  BP: 136/74  Pulse: 100  Temp:   Resp: 20    Complications: No apparent anesthesia complications

## 2014-12-18 NOTE — Anesthesia Postprocedure Evaluation (Signed)
  Anesthesia Post-op Note  Patient: Brittany Flores  Procedure(s) Performed: Procedure(s): POST PARTUM TUBAL LIGATION (N/A)  Patient Location: PACU  Anesthesia Type:Epidural  Level of Consciousness: awake and alert   Airway and Oxygen Therapy: Patient Spontanous Breathing  Post-op Pain: none  Post-op Assessment: Post-op Vital signs reviewed, Patient's Cardiovascular Status Stable, Respiratory Function Stable and Patent Airway LLE Motor Response: Purposeful movement LLE Sensation: Numbness RLE Motor Response: Purposeful movement RLE Sensation: Numbness      Post-op Vital Signs: Reviewed and stable  Last Vitals:  Filed Vitals:   12/18/14 1249  BP: 139/76  Pulse: 108  Temp: 36.9 C  Resp: 20    Complications: No apparent anesthesia complications

## 2014-12-18 NOTE — Anesthesia Procedure Notes (Signed)
Epidural Patient location during procedure: OB  Staffing Anesthesiologist: Kem Parcher, CHRIS Performed by: anesthesiologist   Preanesthetic Checklist Completed: patient identified, surgical consent, pre-op evaluation, timeout performed, IV checked, risks and benefits discussed and monitors and equipment checked  Epidural Patient position: sitting Prep: DuraPrep Patient monitoring: heart rate, cardiac monitor, continuous pulse ox and blood pressure Approach: midline Location: L3-L4 Injection technique: LOR saline  Needle:  Needle type: Tuohy  Needle gauge: 17 G Needle length: 9 cm Needle insertion depth: 6 cm Catheter type: closed end flexible Catheter size: 19 Gauge Catheter at skin depth: 12 cm Test dose: negative and 2% lidocaine with Epi 1:200 K  Assessment Events: blood not aspirated, injection not painful, no injection resistance, negative IV test and no paresthesia  Additional Notes Reason for block:procedure for pain   

## 2014-12-18 NOTE — Anesthesia Preprocedure Evaluation (Addendum)
Anesthesia Evaluation  Patient identified by MRN, date of birth, ID band Patient awake    Reviewed: Allergy & Precautions, H&P , Patient's Chart, lab work & pertinent test results  Airway Mallampati: II  TM Distance: >3 FB Neck ROM: full    Dental no notable dental hx.    Pulmonary  breath sounds clear to auscultation  Pulmonary exam normal       Cardiovascular Exercise Tolerance: Good hypertension, Rhythm:regular Rate:Normal     Neuro/Psych    GI/Hepatic   Endo/Other    Renal/GU      Musculoskeletal   Abdominal   Peds  Hematology  (+) anemia ,   Anesthesia Other Findings   Reproductive/Obstetrics                            Anesthesia Physical Anesthesia Plan  ASA: II  Anesthesia Plan: Epidural   Post-op Pain Management:    Induction:   Airway Management Planned:   Additional Equipment:   Intra-op Plan:   Post-operative Plan:   Informed Consent: I have reviewed the patients History and Physical, chart, labs and discussed the procedure including the risks, benefits and alternatives for the proposed anesthesia with the patient or authorized representative who has indicated his/her understanding and acceptance.   Dental Advisory Given  Plan Discussed with: CRNA  Anesthesia Plan Comments: (Lab work confirmed with CRNA in room. Platelets okay. Discussed spinal anesthetic, and patient consents to the procedure:  included risk of possible headache,backache, failed block, allergic reaction, and nerve injury. This patient was asked if she had any questions or concerns before the procedure started. )        Anesthesia Quick Evaluation

## 2014-12-19 ENCOUNTER — Encounter (HOSPITAL_COMMUNITY): Payer: Self-pay | Admitting: Family Medicine

## 2014-12-19 LAB — CBC
HCT: 23.4 % — ABNORMAL LOW (ref 36.0–46.0)
Hemoglobin: 7 g/dL — ABNORMAL LOW (ref 12.0–15.0)
MCH: 22.4 pg — AB (ref 26.0–34.0)
MCHC: 29.9 g/dL — AB (ref 30.0–36.0)
MCV: 75 fL — AB (ref 78.0–100.0)
PLATELETS: 217 10*3/uL (ref 150–400)
RBC: 3.12 MIL/uL — ABNORMAL LOW (ref 3.87–5.11)
RDW: 17.2 % — AB (ref 11.5–15.5)
WBC: 14.2 10*3/uL — ABNORMAL HIGH (ref 4.0–10.5)

## 2014-12-19 LAB — CCBB MATERNAL DONOR DRAW

## 2014-12-19 MED ORDER — IBUPROFEN 600 MG PO TABS
600.0000 mg | ORAL_TABLET | Freq: Four times a day (QID) | ORAL | Status: DC
Start: 2014-12-19 — End: 2014-12-20
  Administered 2014-12-19 – 2014-12-20 (×4): 600 mg via ORAL
  Filled 2014-12-19 (×5): qty 1

## 2014-12-19 MED ORDER — DOCUSATE SODIUM 100 MG PO CAPS
100.0000 mg | ORAL_CAPSULE | Freq: Two times a day (BID) | ORAL | Status: DC
Start: 2014-12-19 — End: 2014-12-20
  Administered 2014-12-19 (×2): 100 mg via ORAL
  Filled 2014-12-19 (×3): qty 1

## 2014-12-19 MED ORDER — FERROUS SULFATE 325 (65 FE) MG PO TABS
325.0000 mg | ORAL_TABLET | Freq: Two times a day (BID) | ORAL | Status: DC
Start: 2014-12-19 — End: 2014-12-20
  Administered 2014-12-19 (×2): 325 mg via ORAL
  Filled 2014-12-19 (×3): qty 1

## 2014-12-19 NOTE — Progress Notes (Signed)
UR chart review completed.  

## 2014-12-19 NOTE — Progress Notes (Signed)
Post Partum Day 1  Subjective:  Brittany Flores is a 29 y.o. [id number] [redacted]w[redacted]d s/p vacuum-assisted VBAC and BTL.  Brittany Flores reports continued heavy bleeding. She says it remains heavier than a heavy day of her period, and she is also passing large clots. She continues to have significant cramping. Of note, her hemoglobin today is 7 (baseline 9), however she denies dizziness, is not tachycardic, and is mildly hypertensive (systolics mid 140s). Pt denies problems with ambulating, voiding or po intake.  She denies nausea or vomiting.  Pain is moderately controlled.  She has not had flatus. She has not had bowel movement.  Lochia Very large.  Plan for birth control is bilateral tubal ligation.  Method of Feeding: Breast  Objective: BP 148/98 mmHg  Pulse 92  Temp(Src) 98.5 F (36.9 C) (Oral)  Resp 19  Ht 5' 9.5" (1.765 m)  Wt 202 lb (91.627 kg)  BMI 29.41 kg/m2  SpO2 100%  LMP 03/23/2014 (Exact Date)  Breastfeeding? Unknown  Physical Exam:  General: alert, cooperative and no distress Lochia: heavy flow  Chest: CTAB Heart: RRR no m/r/g Abdomen: +BS, soft, nontender, fundus firm at/below umbilicus, honeycomb dressing in place  Uterine Fundus: firm DVT Evaluation: No evidence of DVT seen on physical exam. Extremities: trace edema   Recent Labs  12/18/14 1945 12/19/14 0515  HGB 7.6* 7.0*  HCT 24.6* 23.4*    Assessment/Plan:  ASSESSMENT: Brittany Flores is a 29 y.o. [id number] 110w2d ppd #1 s/p vacuum-assisted VBAC and POD #1 s/p BTL doing well.   - Continue to monitor bleeding - Continue Fe BID - Probable discharge tomorrow    LOS: 2 days   [name] 12/19/2014, 9:03 AM   OB fellow attestation Post Partum Day 1 I have seen and examined this patient and agree with above documentation in the resident's note.   Brittany Flores is a 29 y.o. [id number] s/p VAVD and VBAC with BTL.  Pt denies problems with ambulating, voiding or po intake. Pain is well controlled.  Plan for  birth control is bilateral tubal ligation.  Method of Feeding: Breast  PE:  BP 137/51 mmHg  Pulse 82  Temp(Src) 98.5 F (36.9 C) (Oral)  Resp 20  Ht 5' 9.5" (1.765 m)  Wt 202 lb (91.627 kg)  BMI 29.41 kg/m2  SpO2 100%  LMP 03/23/2014 (Exact Date)  Breastfeeding? Unknown Fundus firm  Plan for discharge: PPD#2  Federico Flake, MD 5:10 PM

## 2014-12-20 ENCOUNTER — Ambulatory Visit: Payer: Self-pay

## 2014-12-20 ENCOUNTER — Inpatient Hospital Stay (HOSPITAL_COMMUNITY): Admission: RE | Admit: 2014-12-20 | Payer: Medicaid Other | Source: Ambulatory Visit

## 2014-12-20 MED ORDER — OXYCODONE-ACETAMINOPHEN 5-325 MG PO TABS: 1.0000 | ORAL_TABLET | ORAL | Status: AC | PRN

## 2014-12-20 MED ORDER — DOCUSATE SODIUM 100 MG PO CAPS: 100.0000 mg | ORAL_CAPSULE | Freq: Two times a day (BID) | ORAL | Status: AC

## 2014-12-20 MED ORDER — FERROUS SULFATE 325 (65 FE) MG PO TABS: 325.0000 mg | ORAL_TABLET | Freq: Two times a day (BID) | ORAL | Status: AC

## 2014-12-20 MED ORDER — IBUPROFEN 600 MG PO TABS: 600.0000 mg | ORAL_TABLET | Freq: Four times a day (QID) | ORAL | Status: AC

## 2014-12-20 NOTE — Discharge Summary (Signed)
Obstetric Discharge Summary Reason for Admission: onset of labor Prenatal Procedures: NST and ultrasound Intrapartum Procedures: vacuum Postpartum Procedures: P.P. tubal ligation Complications-Operative and Postpartum: 2nd degree perineal laceration and hemorrhage  Delivery Summary: At 10:16 AM a viable female was delivered via VBAC, Vacuum Assisted (3 pulls, one-pop-off) secondary to poor maternal effort / maternal exhaustion from the +2 station, (Presentation: ; Occiput Transverse). APGAR: 9, 9; weight 7 lb 4.6 oz (3305 g).  Placenta status: Intact, Spontaneous. Cord: 3 vessels with the following complications: PPH of 800 ml, likely secondary to retained placental membrane that was removed with ring forceps, uterus firm afterward, cytotec 1000 given rectally. Cord pH: not obtained  Anesthesia: Epidural  Episiotomy: None Lacerations: 2nd degree Suture Repair: 3.0 vicryl Est. Blood Loss (mL): 843  Hospital Course:  Active Problems:   Pre-existing hypertension, antepartum   Previous cesarean delivery affecting pregnancy, antepartum   Active labor at term   Brittany Flores is a 28 y.o. [id number] s/p vacuum-assisted vaginal delivery.  Patient was admitted with onset of labor.  She has postpartum course that was uncomplicated including no problems with ambulating, PO intake, urination, pain, or bleeding. The pt feels ready to go home and  will be discharged with outpatient follow-up.   Today: No acute events overnight.  Pt denies problems with ambulating, voiding or po intake.  She denies nausea or vomiting.  Pain is well controlled.  She has had flatus. She has not had bowel movement.  Lochia Small.  Plan for birth control is bilateral tubal ligation.  Method of Feeding: breast and bottle.  Physical Exam:  General: alert, cooperative and no distress Lochia: appropriate Uterine Fundus: firm Incision: healing well, no significant drainage DVT Evaluation: No evidence of DVT seen on  physical exam.  H/H: Lab Results  Component Value Date/Time   HGB 7.0* 12/19/2014 05:15 AM   HCT 23.4* 12/19/2014 05:15 AM    Discharge Diagnoses: Term Pregnancy-delivered  Discharge Information: Date: 12/20/2014 Activity: pelvic rest Diet: routine  Medications: PNV, Ibuprofen, Colace, Iron and Percocet Breast feeding:  Yes Condition: stable Instructions: refer to handout Discharge to: home      Medication List    STOP taking these medications        aspirin 81 MG tablet     HYDROcodone-acetaminophen 5-325 MG per tablet  Commonly known as:  NORCO/VICODIN      TAKE these medications        docusate sodium 100 MG capsule  Commonly known as:  COLACE  Take 1 capsule (100 mg total) by mouth 2 (two) times daily.     ferrous sulfate 325 (65 FE) MG tablet  Take 1 tablet (325 mg total) by mouth 2 (two) times daily with a meal.     ibuprofen 600 MG tablet  Commonly known as:  ADVIL,MOTRIN  Take 1 tablet (600 mg total) by mouth every 6 (six) hours.     Medical Compression Pantyhose Misc  Pregnancy compression pantyhose, 15-20 compression     oxyCODONE-acetaminophen 5-325 MG per tablet  Commonly known as:  PERCOCET/ROXICET  Take 1 tablet by mouth every 4 (four) hours as needed for severe pain.     Prenatal Vitamins 28-0.8 MG Tabs  Take 1 tablet by mouth daily.       Follow-up Information    Schedule an appointment as soon as possible for a visit with [facility]   Specialty:  Obstetrics and Gynecology   Why:  for postpartum visit   Contact information:  [address] [place] [address] [phone]     Caryl Ada, DO 12/20/2014, 7:28 AM PGY-2, [facility]  OB FELLOW DISCHARGE ATTESTATION  I have seen and examined this patient and agree with above documentation in the resident's note.   Brittany Flores is a 29 y.o. [id number] s/p vavd/vbac and btl.   Pain is well controlled.   PE:  BP 134/63 mmHg  Pulse  86  Temp(Src) 98.4 F (36.9 C) (Oral)  Resp 16  Ht 5' 9.5" (1.765 m)  Wt 202 lb (91.627 kg)  BMI 29.41 kg/m2  SpO2 100%  LMP 03/23/2014 (Exact Date)  Breastfeeding? Unknown Fundus firm   Recent Labs  12/18/14 1945 12/19/14 0515  HGB 7.6* 7.0*  HCT 24.6* 23.4*     Plan: discharge today - postpartum care discussed - f/u clinic this week for bp check - oral iron bid for pph and resulting asymptomatic anemia with h of 7 2/2 pph of 800 ml - btl incision site healing well   Silvano Bilis, MD 9:18 AM

## 2014-12-20 NOTE — Lactation Note (Signed)
This note was copied from the chart of Brittany Fortunata Roldan. Lactation Consultation Note  Patient Name: Brittany Flores [id number] Date: 12/20/2014 Reason for consult: Other (Comment) (checked on mom , note on door do not disturb x2 , and then was discharged before LC visit - see LC NOTE )  Baby at 6 % weight loss, Breast fed 11 x's , Latch scores 8 , supplemented per moms choice after breast feeding x 9 10 ml , 6 wets , 3 stools ,  At 10.9 Bili check at 37 hours , at 0545 7.8.    Maternal Data    Feeding    LATCH Score/Interventions                      Lactation Tools Discussed/Used     Consult Status Consult Status: Complete Date: 12/20/14    Kathrin Greathouse 12/20/2014, 11:58 AM

## 2014-12-20 NOTE — Discharge Instructions (Signed)

## 2014-12-22 ENCOUNTER — Other Ambulatory Visit: Payer: Medicaid Other

## 2014-12-31 ENCOUNTER — Telehealth: Payer: Self-pay | Admitting: *Deleted

## 2014-12-31 NOTE — Telephone Encounter (Signed)
Received message left on nurse line on 12/31/14 at 1531.  [employer], [employer]er, [employer], states she saw patient today for her postpartum visit.  States the patient delivered on 12/18/14.  States she had pre-eclampsia with her first child.  States she knows we have been watching this with this pregnancy.  States blood pressure today was 156/82.  States patient says she has had some mild headaches and a couple of severe ones but nothing now.  No edema.  States she wanted to let us know.  States she told the patient to come in to be seen if she develops a severe headache or has any visual changes.  Jeannie wants to know if we want her to go out to see patient next week for a recheck or would we prefer to see the patient in the office.  Requests a return call.

## 2015-01-02 NOTE — Telephone Encounter (Signed)
Discussed with Dr. Debroah Loop, and called [facility] and left a message that he would like her to do another blood pressure check on Abbrielle at a home visit next week.

## 2015-01-29 ENCOUNTER — Ambulatory Visit: Payer: Medicaid Other | Admitting: Medical

## 2015-01-30 ENCOUNTER — Encounter: Payer: Self-pay | Admitting: *Deleted

## 2015-02-02 ENCOUNTER — Ambulatory Visit (INDEPENDENT_AMBULATORY_CARE_PROVIDER_SITE_OTHER): Payer: Medicaid Other | Admitting: Family Medicine

## 2015-02-02 ENCOUNTER — Encounter: Payer: Self-pay | Admitting: Family Medicine

## 2015-02-02 DIAGNOSIS — O10913 Unspecified pre-existing hypertension complicating pregnancy, third trimester: Secondary | ICD-10-CM

## 2015-02-02 DIAGNOSIS — O0992 Supervision of high risk pregnancy, unspecified, second trimester: Secondary | ICD-10-CM

## 2015-02-02 DIAGNOSIS — O34219 Maternal care for unspecified type scar from previous cesarean delivery: Secondary | ICD-10-CM | POA: Insufficient documentation

## 2015-02-02 DIAGNOSIS — I10 Essential (primary) hypertension: Secondary | ICD-10-CM

## 2015-02-02 MED ORDER — ENALAPRIL MALEATE 10 MG PO TABS: 10.0000 mg | ORAL_TABLET | Freq: Every day | ORAL | Status: AC

## 2015-02-02 NOTE — Patient Instructions (Addendum)
Low-Sodium Eating Plan Sodium raises blood pressure and causes water to be held in the body. Getting less sodium from food will help lower your blood pressure, reduce any swelling, and protect your heart, liver, and kidneys. We get sodium by adding salt (sodium chloride) to food. Most of our sodium comes from canned, boxed, and frozen foods. Restaurant foods, fast foods, and pizza are also very high in sodium. Even if you take medicine to lower your blood pressure or to reduce fluid in your body, getting less sodium from your food is important. WHAT IS MY PLAN? Most people should limit their sodium intake to 2,300 mg a day. Your health care provider recommends that you limit your sodium intake to __3g __ a day.  WHAT DO I NEED TO KNOW ABOUT THIS EATING PLAN? For the low-sodium eating plan, you will follow these general guidelines:  Choose foods with a % Daily Value for sodium of less than 5% (as listed on the food label).   Use salt-free seasonings or herbs instead of table salt or sea salt.   Check with your health care provider or pharmacist before using salt substitutes.   Eat fresh foods.  Eat more vegetables and fruits.  Limit canned vegetables. If you do use them, rinse them well to decrease the sodium.   Limit cheese to 1 oz (28 g) per day.   Eat lower-sodium products, often labeled as "lower sodium" or "no salt added."  Avoid foods that contain monosodium glutamate (MSG). MSG is sometimes added to [place] food and some canned foods.  Check food labels (Nutrition Facts labels) on foods to learn how much sodium is in one serving.  Eat more home-cooked food and less restaurant, buffet, and fast food.  When eating at a restaurant, ask that your food be prepared with less salt or none, if possible.  HOW DO I READ FOOD LABELS FOR SODIUM INFORMATION? The Nutrition Facts label lists the amount of sodium in one serving of the food. If you eat more than one serving, you must  multiply the listed amount of sodium by the number of servings. Food labels may also identify foods as:  Sodium free--Less than 5 mg in a serving.  Very low sodium--35 mg or less in a serving.  Low sodium--140 mg or less in a serving.  Light in sodium--50% less sodium in a serving. For example, if a food that usually has 300 mg of sodium is changed to become light in sodium, it will have 150 mg of sodium.  Reduced sodium--25% less sodium in a serving. For example, if a food that usually has 400 mg of sodium is changed to reduced sodium, it will have 300 mg of sodium. WHAT FOODS CAN I EAT? Grains Low-sodium cereals, including oats, puffed wheat and rice, and shredded wheat cereals. Low-sodium crackers. Unsalted rice and pasta. Lower-sodium bread.  Vegetables Frozen or fresh vegetables. Low-sodium or reduced-sodium canned vegetables. Low-sodium or reduced-sodium tomato sauce and paste. Low-sodium or reduced-sodium tomato and vegetable juices.  Fruits Fresh, frozen, and canned fruit. Fruit juice.  Meat and Other Protein Products Low-sodium canned tuna and salmon. Fresh or frozen meat, poultry, seafood, and fish. Lamb. Unsalted nuts. Dried beans, peas, and lentils without added salt. Unsalted canned beans. Homemade soups without salt. Eggs.  Dairy Milk. Soy milk. Ricotta cheese. Low-sodium or reduced-sodium cheeses. Yogurt.  Condiments Fresh and dried herbs and spices. Salt-free seasonings. Onion and garlic powders. Low-sodium varieties of mustard and ketchup. Lemon juice.  Fats and  Oils Reduced-sodium salad dressings. Unsalted butter.  Other Unsalted popcorn and pretzels.  The items listed above may not be a complete list of recommended foods or beverages. Contact your dietitian for more options. WHAT FOODS ARE NOT RECOMMENDED? Grains Instant hot cereals. Bread stuffing, pancake, and biscuit mixes. Croutons. Seasoned rice or pasta mixes. Noodle soup cups. Boxed or frozen  macaroni and cheese. Self-rising flour. Regular salted crackers. Vegetables Regular canned vegetables. Regular canned tomato sauce and paste. Regular tomato and vegetable juices. Frozen vegetables in sauces. Salted french fries. Olives. Rosita Fire. Relishes. Sauerkraut. Salsa. Meat and Other Protein Products Salted, canned, smoked, spiced, or pickled meats, seafood, or fish. Bacon, ham, sausage, hot dogs, corned beef, chipped beef, and packaged luncheon meats. Salt pork. Jerky. Pickled herring. Anchovies, regular canned tuna, and sardines. Salted nuts. Dairy Processed cheese and cheese spreads. Cheese curds. Blue cheese and cottage cheese. Buttermilk.  Condiments Onion and garlic salt, seasoned salt, table salt, and sea salt. Canned and packaged gravies. Worcestershire sauce. Tartar sauce. Barbecue sauce. Teriyaki sauce. Soy sauce, including reduced sodium. Steak sauce. Fish sauce. Oyster sauce. Cocktail sauce. Horseradish. Regular ketchup and mustard. Meat flavorings and tenderizers. Bouillon cubes. Hot sauce. Tabasco sauce. Marinades. Taco seasonings. Relishes. Fats and Oils Regular salad dressings. Salted butter. Margarine. Ghee. Bacon fat.  Other Potato and tortilla chips. Corn chips and puffs. Salted popcorn and pretzels. Canned or dried soups. Pizza. Frozen entrees and pot pies.  The items listed above may not be a complete list of foods and beverages to avoid. Contact your dietitian for more information. Document Released: 10/08/2001 Document Revised: 04/23/2013 Document Reviewed: 02/20/2013 [facility] Patient Information 2015 [place], [place]. This information is not intended to replace advice given to you by your health care provider. Make sure you discuss any questions you have with your health care provider.  Hypertension Hypertension, commonly called high blood pressure, is when the force of blood pumping through your arteries is too strong. Your arteries are the blood vessels that  carry blood from your heart throughout your body. A blood pressure reading consists of a higher number over a lower number, such as 110/72. The higher number (systolic) is the pressure inside your arteries when your heart pumps. The lower number (diastolic) is the pressure inside your arteries when your heart relaxes. Ideally you want your blood pressure below 120/80. Hypertension forces your heart to work harder to pump blood. Your arteries may become narrow or stiff. Having hypertension puts you at risk for heart disease, stroke, and other problems.  RISK FACTORS Some risk factors for high blood pressure are controllable. Others are not.  Risk factors you cannot control include:   Race. You may be at higher risk if you are African American.  Age. Risk increases with age.  Gender. Men are at higher risk than women before age 40 years. After age 33, women are at higher risk than men. Risk factors you can control include:  Not getting enough exercise or physical activity.  Being overweight.  Getting too much fat, sugar, calories, or salt in your diet.  Drinking too much alcohol. SIGNS AND SYMPTOMS Hypertension does not usually cause signs or symptoms. Extremely high blood pressure (hypertensive crisis) may cause headache, anxiety, shortness of breath, and nosebleed. DIAGNOSIS  To check if you have hypertension, your health care provider will measure your blood pressure while you are seated, with your arm held at the level of your heart. It should be measured at least twice using the same arm. Certain conditions  can cause a difference in blood pressure between your right and left arms. A blood pressure reading that is higher than normal on one occasion does not mean that you need treatment. If one blood pressure reading is high, ask your health care provider about having it checked again. TREATMENT  Treating high blood pressure includes making lifestyle changes and possibly taking medicine.  Living a healthy lifestyle can help lower high blood pressure. You may need to change some of your habits. Lifestyle changes may include:  Following the DASH diet. This diet is high in fruits, vegetables, and whole grains. It is low in salt, red meat, and added sugars.  Getting at least 2 hours of brisk physical activity every week.  Losing weight if necessary.  Not smoking.  Limiting alcoholic beverages.  Learning ways to reduce stress. If lifestyle changes are not enough to get your blood pressure under control, your health care provider may prescribe medicine. You may need to take more than one. Work closely with your health care provider to understand the risks and benefits. HOME CARE INSTRUCTIONS  Have your blood pressure rechecked as directed by your health care provider.   Take medicines only as directed by your health care provider. Follow the directions carefully. Blood pressure medicines must be taken as prescribed. The medicine does not work as well when you skip doses. Skipping doses also puts you at risk for problems.   Do not smoke.   Monitor your blood pressure at home as directed by your health care provider. SEEK MEDICAL CARE IF:   You think you are having a reaction to medicines taken.  You have recurrent headaches or feel dizzy.  You have swelling in your ankles.  You have trouble with your vision. SEEK IMMEDIATE MEDICAL CARE IF:  You develop a severe headache or confusion.  You have unusual weakness, numbness, or feel faint.  You have severe chest or abdominal pain.  You vomit repeatedly.  You have trouble breathing. MAKE SURE YOU:   Understand these instructions.  Will watch your condition.  Will get help right away if you are not doing well or get worse. Document Released: 04/18/2005 Document Revised: 09/02/2013 Document Reviewed: 02/08/2013 [facility] Patient Information 2015 [place], [place]. This information is not intended to replace  advice given to you by your health care provider. Make sure you discuss any questions you have with your health care provider. Postpartum Care After Vaginal Delivery After you deliver your newborn (postpartum period), the usual stay in the hospital is 24-72 hours. If there were problems with your labor or delivery, or if you have other medical problems, you might be in the hospital longer.  While you are in the hospital, you will receive help and instructions on how to care for yourself and your newborn during the postpartum period.  While you are in the hospital:  Be sure to tell your nurses if you have pain or discomfort, as well as where you feel the pain and what makes the pain worse.  If you had an incision made near your vagina (episiotomy) or if you had some tearing during delivery, the nurses may put ice packs on your episiotomy or tear. The ice packs may help to reduce the pain and swelling.  If you are breastfeeding, you may feel uncomfortable contractions of your uterus for a couple of weeks. This is normal. The contractions help your uterus get back to normal size.  It is normal to have some bleeding after delivery.  For the first 1-3 days after delivery, the flow is red and the amount may be similar to a period.  It is common for the flow to start and stop.  In the first few days, you may pass some small clots. Let your nurses know if you begin to pass large clots or your flow increases.  Do not  flush blood clots down the toilet before having the nurse look at them.  During the next 3-10 days after delivery, your flow should become more watery and pink or brown-tinged in color.  Ten to fourteen days after delivery, your flow should be a small amount of yellowish-white discharge.  The amount of your flow will decrease over the first few weeks after delivery. Your flow may stop in 6-8 weeks. Most women have had their flow stop by 12 weeks after delivery.  You should change  your sanitary pads frequently.  Wash your hands thoroughly with soap and water for at least 20 seconds after changing pads, using the toilet, or before holding or feeding your newborn.  You should feel like you need to empty your bladder within the first 6-8 hours after delivery.  In case you become weak, lightheaded, or faint, call your nurse before you get out of bed for the first time and before you take a shower for the first time.  Within the first few days after delivery, your breasts may begin to feel tender and full. This is called engorgement. Breast tenderness usually goes away within 48-72 hours after engorgement occurs. You may also notice milk leaking from your breasts. If you are not breastfeeding, do not stimulate your breasts. Breast stimulation can make your breasts produce more milk.  Spending as much time as possible with your newborn is very important. During this time, you and your newborn can feel close and get to know each other. Having your newborn stay in your room (rooming in) will help to strengthen the bond with your newborn. It will give you time to get to know your newborn and become comfortable caring for your newborn.  Your hormones change after delivery. Sometimes the hormone changes can temporarily cause you to feel sad or tearful. These feelings should not last more than a few days. If these feelings last longer than that, you should talk to your caregiver.  If desired, talk to your caregiver about methods of family planning or contraception.  Talk to your caregiver about immunizations. Your caregiver may want you to have the following immunizations before leaving the hospital:  Tetanus, diphtheria, and pertussis (Tdap) or tetanus and diphtheria (Td) immunization. It is very important that you and your family (including grandparents) or others caring for your newborn are up-to-date with the Tdap or Td immunizations. The Tdap or Td immunization can help protect  your newborn from getting ill.  Rubella immunization.  Varicella (chickenpox) immunization.  Influenza immunization. You should receive this annual immunization if you did not receive the immunization during your pregnancy. Document Released: 02/13/2007 Document Revised: 01/11/2012 Document Reviewed: 12/14/2011 [facility] Patient Information 2015 [place], [place]. This information is not intended to replace advice given to you by your health care provider. Make sure you discuss any questions you have with your health care provider.

## 2015-02-02 NOTE — Progress Notes (Signed)
Patient ID: Brittany Flores, female   DOB: [birth year], 29 y.o.   MRN: [id number] Subjective:     Brittany Flores is a 29 y.o. [phone] female who presents for a postpartum visit. She is 7 weeks postpartum following a vacuum assisted vaginal delivery with PPH 2/2 to retained POC that was removed at the time of delivery. Had a 2nd degree OB laceration. I have fully reviewed the prenatal and intrapartum course. The delivery was at 39 gestational weeks. Outcome: spontaneous vaginal delivery. Anesthesia: epidural. Postpartum course has been unremarkable. Baby's course has been unremarkable. Baby is feeding by breast. Bleeding no bleeding. Bowel function is normal. Bladder function is normal. Patient is not sexually active. Contraception method is tubal ligation. Postpartum depression screening: negative.  The following portions of the patient's history were reviewed and updated as appropriate: allergies, current medications, past family history, past medical history, past social history, past surgical history and problem list.  Review of Systems Pertinent items are noted in HPI.   Objective:    BP 155/93 mmHg  Pulse 86  Temp(Src) 99.1 F (37.3 C)  Wt 192 lb 1.6 oz (87.136 kg)  Breastfeeding? Yes  General:  alert, cooperative and appears stated age   Breasts:  inspection negative, no nipple discharge or bleeding, no masses or nodularity palpable  Lungs: normal WOB  Heart:  regular rate and rhythm, S1, S2 normal, no murmur, click, rub or gallop  Abdomen: soft, non-tender; bowel sounds normal; no masses,  no organomegaly   Vulva:  normal  Vagina: normal vagina  Cervix:  normal  Corpus: normal size, contour, position, consistency, mobility, non-tender  Adnexa:  normal adnexa  Rectal Exam: Not performed.        Assessment:    postpartum exam. Pap smear not done at today's visit.   Plan:   1. Contraception: tubal ligation- performed after delivery while in hospital 2. CHTN: will start  enalapril  today 3. Breastfeeding: going well 4. Discussed resuming sexual activity 5. Follow up in: 2 weeks for blood pressure check on medication

## 2015-09-14 ENCOUNTER — Encounter: Payer: Self-pay | Admitting: Student

## 2015-09-14 ENCOUNTER — Ambulatory Visit: Payer: Medicaid Other | Admitting: Student

## 2015-09-14 NOTE — Progress Notes (Signed)
Pt here today for STD screening. Had BTL 2 years ago. [facility] [employer]. Per Mayra NeerKelly Rassette, today's visit is not covered. [facility].

## 2016-04-20 IMAGING — US US OB FOLLOW-UP
1 series · 12 of 28 positions shown · non-contrast
Comparison: none

[Series 1: us ob follow up · 12 of 91 slices shown]
[im 4/91]
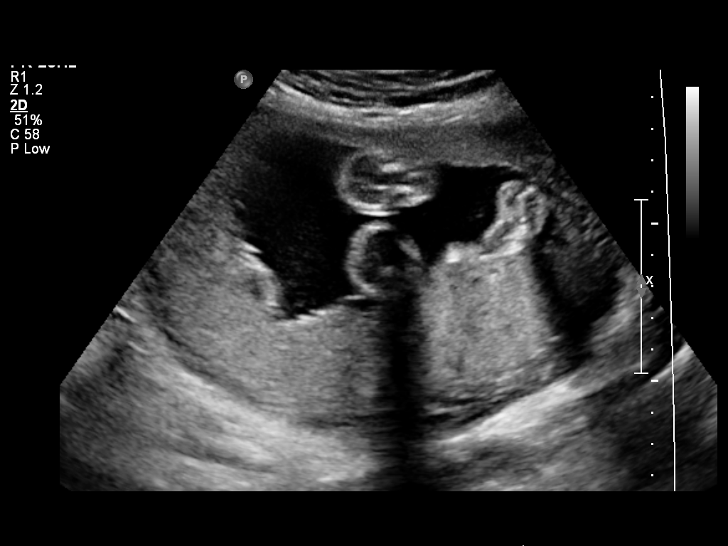
[im 11/91]
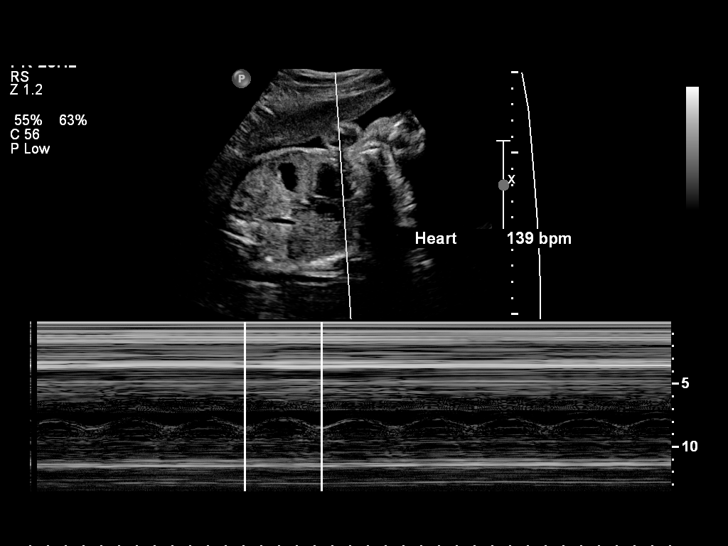
[im 17/91]
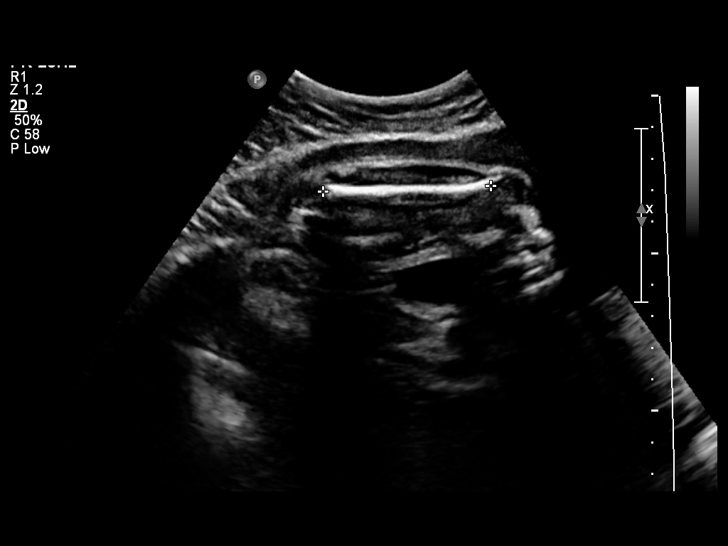
[im 27/91]
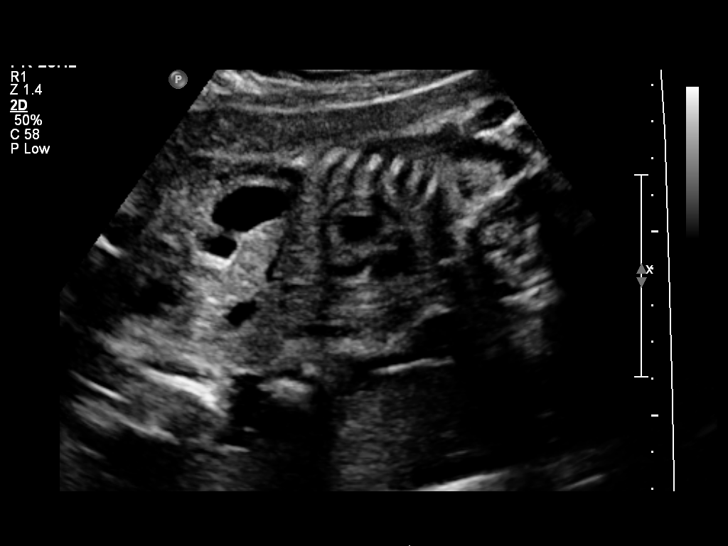
[im 34/91]
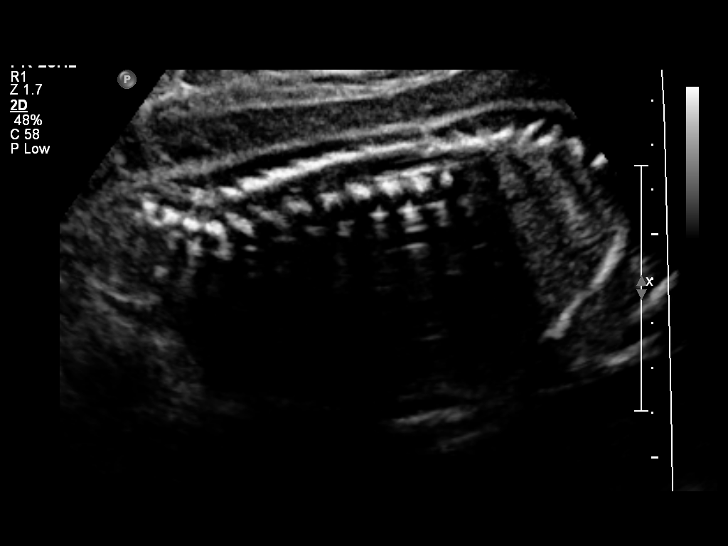
[im 41/91]
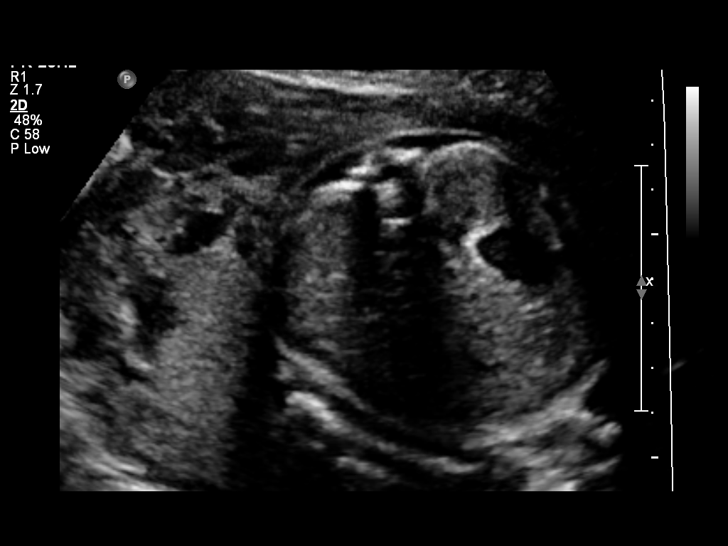
[im 51/91]
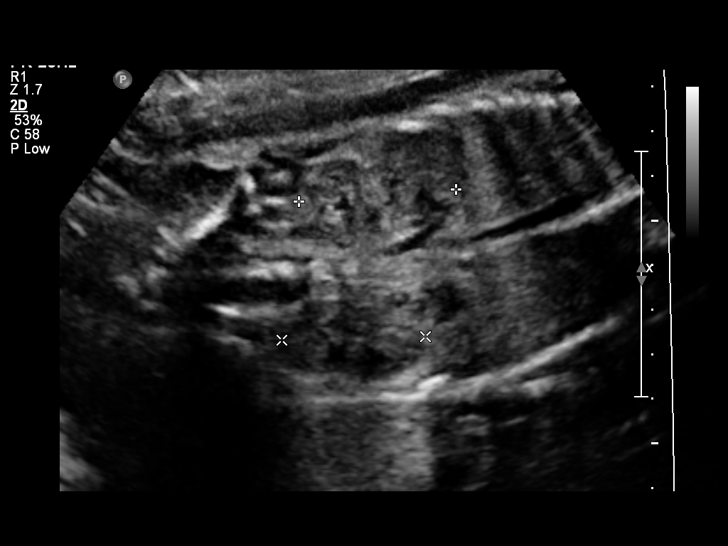
[im 57/91]
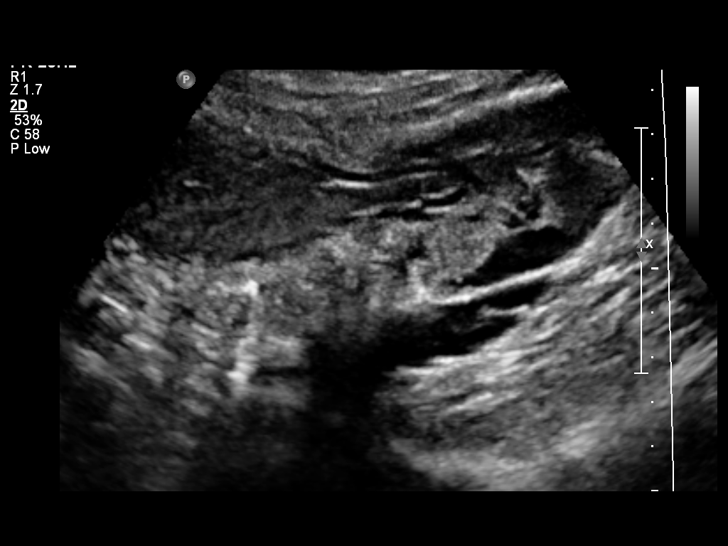
[im 64/91]
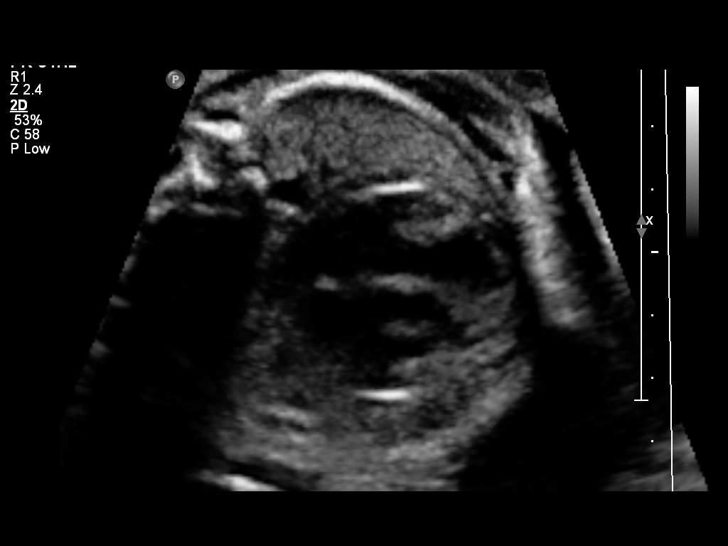
[im 74/91]
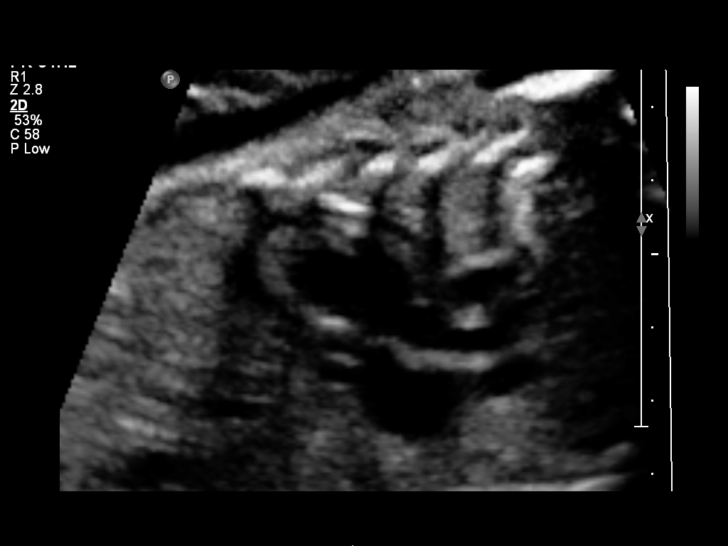
[im 81/91]
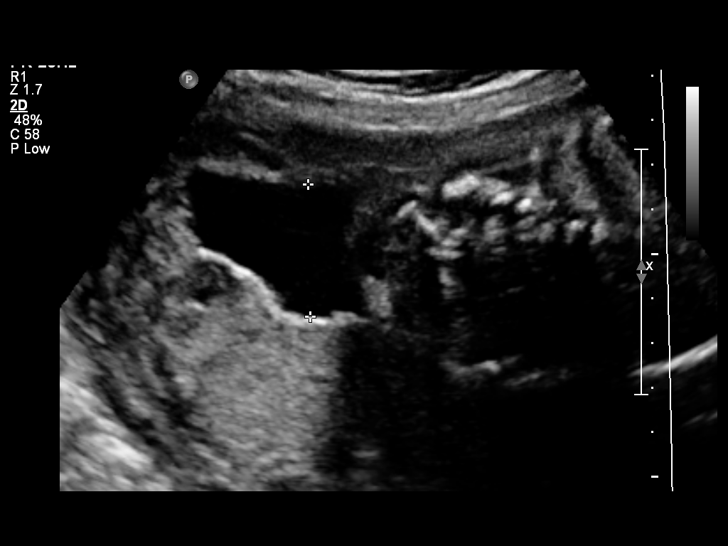
[im 87/91]
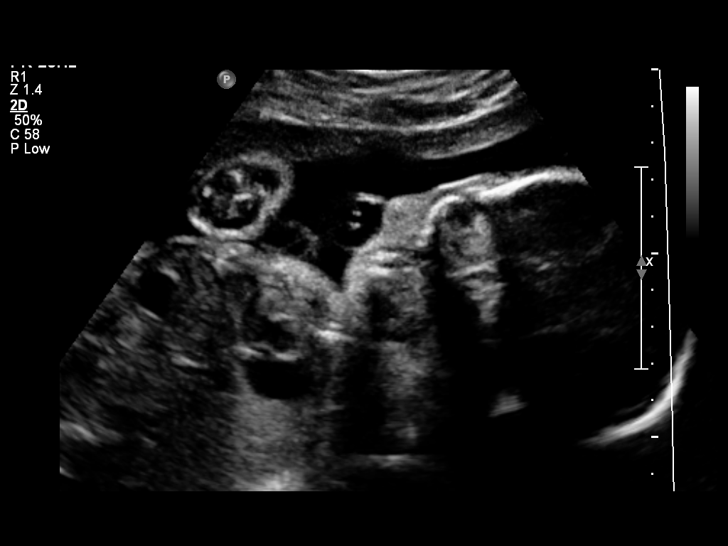

[12 of 28 positions shown; findings below may reference images not displayed]

OBSTETRICS REPORT
(Signed Final 10/02/2014 [DATE])

Service(s) Provided

US OB FOLLOW UP                                       76816.1
Indications

Hypertension - Chronic/Pre-existing (ASA)
Previous cesarean section
28 weeks gestation of pregnancy
Fetal Evaluation

Num Of Fetuses:    1
Fetal Heart Rate:  139                          bpm
Cardiac Activity:  Observed
Presentation:      Cephalic
Placenta:          Posterior, above cervical
os
P. Cord            Previously seen as normal
Insertion:
AFI Sum:     14.29   cm       48  %Tile     Larg Pckt:    4.61  cm
RUQ:   2.95    cm   RLQ:    2.87   cm    LUQ:   4.61    cm   LLQ:    3.86   cm
Biometry

BPD:     74.2  mm     G. Age:  29w 5d                CI:        77.66   70 - 86
FL/HC:      19.6   18.8 -
20.6
HC:     266.5  mm     G. Age:  29w 0d       41  %    HC/AC:      1.12   1.05 -
1.21
AC:     238.3  mm     G. Age:  28w 1d       38  %    FL/BPD:     70.4   71 - 87
FL:      52.2  mm     G. Age:  27w 6d       23  %    FL/AC:      21.9   20 - 24
HUM:     47.9  mm     G. Age:  28w 0d       42  %
CER:     32.6  mm     G. Age:  28w 4d       53  %

Est. FW:    6615  gm    2 lb 10 oz      50  %
Gestational Age

LMP:           27w 4d        Date:  03/23/14                 EDD:   12/28/14
U/S Today:     28w 5d                                        EDD:   12/20/14
Best:          28w 2d     Det. By:  Early Ultrasound         EDD:   12/23/14
(05/30/14)
Anatomy
Cranium:          Appears normal         Aortic Arch:      Appears normal
Fetal Cavum:      Appears normal         Ductal Arch:      Appears normal
Ventricles:       Appears normal         Diaphragm:        Appears normal
Choroid Plexus:   Appears normal         Stomach:          Appears normal, left
sided
Cerebellum:       Appears normal         Abdomen:          Appears normal
Posterior Fossa:  Appears normal         Abdominal Wall:   Previously seen
Nuchal Fold:      Previously seen        Cord Vessels:     Previously seen
Face:             Appears normal         Kidneys:          Appear normal
(orbits and profile)
Lips:             Previously seen        Bladder:          Appears normal
Heart:            Appears normal         Spine:            Appears normal
(4CH, axis, and
situs)
RVOT:             Appears normal         Lower             Previously seen
Extremities:
LVOT:             Appears normal         Upper             Previously seen
Extremities:

Other:  Fetus appears to be a female. Heels and 5th digit previously seen.
Cervix Uterus Adnexa

Cervical Length:    4.4      cm

Cervix:       Normal appearance by transabdominal scan.
Left Ovary:    Within normal limits.
Right Ovary:   Within normal limits.
Impression

Single IUP at 28w 2d
CHTN, obesity
Normal interval anatomy
The estimated fetal weight today is at the 50th %tile.
Posterior placenta without previa
Normal amniotic fluid volume
Recommendations

Recommend follow-up ultrasound examination in 4 weeks for
interval growth due to CHTN.
Antenatal testing beginning at 32 weeks

## 2017-01-10 ENCOUNTER — Emergency Department (HOSPITAL_BASED_OUTPATIENT_CLINIC_OR_DEPARTMENT_OTHER): Payer: Self-pay

## 2017-01-10 ENCOUNTER — Encounter (HOSPITAL_BASED_OUTPATIENT_CLINIC_OR_DEPARTMENT_OTHER): Payer: Self-pay | Admitting: Emergency Medicine

## 2017-01-10 ENCOUNTER — Emergency Department (HOSPITAL_BASED_OUTPATIENT_CLINIC_OR_DEPARTMENT_OTHER)
Admission: EM | Admit: 2017-01-10 | Discharge: 2017-01-10 | Disposition: A | Discharge status: Home / Self Care | Payer: Self-pay | Attending: Emergency Medicine | Admitting: Emergency Medicine

## 2017-01-10 DIAGNOSIS — Z79899 Other long term (current) drug therapy: Secondary | ICD-10-CM | POA: Insufficient documentation

## 2017-01-10 DIAGNOSIS — I10 Essential (primary) hypertension: Secondary | ICD-10-CM | POA: Insufficient documentation

## 2017-01-10 DIAGNOSIS — N8311 Corpus luteum cyst of right ovary: Secondary | ICD-10-CM | POA: Insufficient documentation

## 2017-01-10 LAB — URINALYSIS, ROUTINE W REFLEX MICROSCOPIC
Bilirubin Urine: NEGATIVE
Glucose, UA: NEGATIVE mg/dL
Hgb urine dipstick: NEGATIVE
KETONES UR: NEGATIVE mg/dL
NITRITE: NEGATIVE
PROTEIN: NEGATIVE mg/dL
Specific Gravity, Urine: 1.015 (ref 1.005–1.030)
pH: 8 (ref 5.0–8.0)

## 2017-01-10 LAB — CBC WITH DIFFERENTIAL/PLATELET
Basophils Absolute: 0 10*3/uL (ref 0.0–0.1)
Basophils Relative: 0 %
Eosinophils Absolute: 0.1 10*3/uL (ref 0.0–0.7)
Eosinophils Relative: 1 %
HEMATOCRIT: 35 % — AB (ref 36.0–46.0)
HEMOGLOBIN: 11.5 g/dL — AB (ref 12.0–15.0)
LYMPHS ABS: 1.9 10*3/uL (ref 0.7–4.0)
LYMPHS PCT: 37 %
MCH: 26.7 pg (ref 26.0–34.0)
MCHC: 32.9 g/dL (ref 30.0–36.0)
MCV: 81.2 fL (ref 78.0–100.0)
MONOS PCT: 7 %
Monocytes Absolute: 0.4 10*3/uL (ref 0.1–1.0)
NEUTROS ABS: 2.9 10*3/uL (ref 1.7–7.7)
NEUTROS PCT: 55 %
Platelets: 224 10*3/uL (ref 150–400)
RBC: 4.31 MIL/uL (ref 3.87–5.11)
RDW: 16.7 % — ABNORMAL HIGH (ref 11.5–15.5)
WBC: 5.3 10*3/uL (ref 4.0–10.5)

## 2017-01-10 LAB — WET PREP, GENITAL
SPERM: NONE SEEN
Trich, Wet Prep: NONE SEEN
YEAST WET PREP: NONE SEEN

## 2017-01-10 LAB — COMPREHENSIVE METABOLIC PANEL
ALBUMIN: 4 g/dL (ref 3.5–5.0)
ALT: 11 U/L — ABNORMAL LOW (ref 14–54)
ANION GAP: 7 (ref 5–15)
AST: 17 U/L (ref 15–41)
Alkaline Phosphatase: 50 U/L (ref 38–126)
BUN: 8 mg/dL (ref 6–20)
CHLORIDE: 105 mmol/L (ref 101–111)
CO2: 24 mmol/L (ref 22–32)
Calcium: 9.4 mg/dL (ref 8.9–10.3)
Creatinine, Ser: 0.62 mg/dL (ref 0.44–1.00)
GFR calc non Af Amer: >60 mL/min (ref >60)
GLUCOSE: 99 mg/dL (ref 65–99)
Potassium: 3.5 mmol/L (ref 3.5–5.1)
SODIUM: 136 mmol/L (ref 135–145)
Total Bilirubin: 0.4 mg/dL (ref 0.3–1.2)
Total Protein: 7.8 g/dL (ref 6.5–8.1)

## 2017-01-10 LAB — URINALYSIS, MICROSCOPIC (REFLEX)

## 2017-01-10 LAB — PREGNANCY, URINE: PREG TEST UR: NEGATIVE

## 2017-01-10 LAB — LIPASE, BLOOD: Lipase: 26 U/L (ref 11–51)

## 2017-01-10 MED ORDER — SODIUM CHLORIDE 0.9 % IV BOLUS (SEPSIS)
1000.0000 mL | Freq: Once | INTRAVENOUS | Status: AC
  Administered 2017-01-10: 1000 mL via INTRAVENOUS

## 2017-01-10 MED ORDER — FENTANYL CITRATE (PF) 100 MCG/2ML IJ SOLN
50.0000 ug | Freq: Once | INTRAMUSCULAR | Status: AC
  Administered 2017-01-10: 50 ug via INTRAVENOUS
  Filled 2017-01-10: qty 2

## 2017-01-10 MED ORDER — MELOXICAM 15 MG PO TABS: 15.0000 mg | ORAL_TABLET | Freq: Every day | ORAL | 0 refills | Status: AC

## 2017-01-10 MED ORDER — IOPAMIDOL (ISOVUE-300) INJECTION 61%
100.0000 mL | Freq: Once | INTRAVENOUS | Status: AC | PRN
  Administered 2017-01-10: 100 mL via INTRAVENOUS

## 2017-01-10 MED ORDER — TRAMADOL HCL 50 MG PO TABS: 50.0000 mg | ORAL_TABLET | Freq: Four times a day (QID) | ORAL | 0 refills | Status: AC | PRN

## 2017-01-10 MED ORDER — ONDANSETRON HCL 4 MG/2ML IJ SOLN
4.0000 mg | Freq: Once | INTRAMUSCULAR | Status: AC
  Administered 2017-01-10: 4 mg via INTRAVENOUS
  Filled 2017-01-10: qty 2

## 2017-01-10 MED FILL — MELOXICAM 15 MG TABLET: 15 | 30 days supply | Qty: 30 | Fill #0

## 2017-01-10 MED FILL — traMADol HCL 50 MG TABS: 50 | 2 days supply | Qty: 10 | Fill #0

## 2017-01-10 NOTE — ED Notes (Signed)
Patient in US at this time will obtain vitals when patient returns 

## 2017-01-10 NOTE — Discharge Instructions (Signed)
Contact a health care provider if: °Your periods are late, irregular, or painful, or they stop. °You have pelvic pain that does not go away. °You have pressure on your bladder or trouble emptying your bladder completely. °You have pain during sex. °You have any of the following in your abdomen: °A feeling of fullness. °Pressure. °Discomfort. °Pain that does not go away. °Swelling. °You feel generally ill. °You become constipated. °You lose your appetite. °You develop severe acne. °You start to have more body hair and facial hair. °You are gaining weight or losing weight without changing your exercise and eating habits. °You think you may be pregnant. °Get help right away if: °You have abdominal pain that is severe or gets worse. °You cannot eat or drink without vomiting. °You suddenly develop a fever. °Your menstrual period is much heavier than usual. °

## 2017-01-10 NOTE — ED Provider Notes (Signed)
[facility] Note   CSN: [id number] Arrival date & time: 01/10/17  4098     History   Chief Complaint Chief Complaint  Patient presents with  . Flank Pain  . Abdominal Pain    HPI Brittany Flores is a 31 y.o. female  ower abdominal pain began today at work, Progressively worsening until it became unbearable. HX ectopic. She describes it as pulling or tearing Located on the R side. Pain radiates to her flank. Pain is constant and worse if she lays completely flat. Denies urinary or vaginal sxs.   HPI  Past Medical History:  Diagnosis Date  . Gestational hypertension     Patient Active Problem List   Diagnosis Date Noted  . Postpartum care and examination 02/02/2015  . Essential hypertension 02/02/2015    Past Surgical History:  Procedure Laterality Date  . CESAREAN SECTION    . MOUTH SURGERY    . TUBAL LIGATION N/A 12/18/2014   Procedure: POST PARTUM TUBAL LIGATION;  Surgeon: Reva Bores, MD;  Location: [facility];  Service: Gynecology;  Laterality: N/A;    OB History    Gravida Para Term Preterm AB Living   SAB TAB Ectopic Multiple Live Births     2   0 2       Home Medications    Prior to Admission medications   Medication Sig Start Date End Date Taking? Authorizing Provider  docusate sodium (COLACE) 100 MG capsule Take 1 capsule (100 mg total) by mouth 2 (two) times daily. Patient not taking: Reported on 09/14/2015 12/20/14   Pincus Large, DO  Elastic Bandages & Supports (MEDICAL COMPRESSION PANTYHOSE) MISC Pregnancy compression pantyhose, 15-20 compression Patient not taking: Reported on 02/02/2015 10/02/14   Marlis Edelson, CNM  enalapril (VASOTEC) 10 MG tablet Take 1 tablet (10 mg total) by mouth daily. 02/02/15   Federico Flake, MD  ferrous sulfate 325 (65 FE) MG tablet Take 1 tablet (325 mg total) by mouth 2 (two) times daily with a meal. Patient not taking: Reported on 02/02/2015 12/20/14   Pincus Large, DO    ibuprofen (ADVIL,MOTRIN) 600 MG tablet Take 1 tablet (600 mg total) by mouth every 6 (six) hours. Patient not taking: Reported on 02/02/2015 12/20/14   Pincus Large, DO  oxyCODONE-acetaminophen (PERCOCET/ROXICET) 5-325 MG per tablet Take 1 tablet by mouth every 4 (four) hours as needed for severe pain. Patient not taking: Reported on 02/02/2015 12/20/14   Pincus Large, DO  Prenatal Vit-Fe Fumarate-FA (PRENATAL VITAMINS) 28-0.8 MG TABS Take 1 tablet by mouth daily. Patient not taking: Reported on 09/14/2015 05/30/14   Hurshel Party, CNM    Family History Family History  Problem Relation Age of Onset  . Diabetes Maternal Grandmother   . Kidney disease Maternal Grandmother   . Heart disease Mother     Social History Social History  Substance Use Topics  . Smoking status: Never Smoker  . Smokeless tobacco: Never Used  . Alcohol use No     Allergies   Patient has no known allergies.   Review of Systems Review of Systems  Ten systems reviewed and are negative for acute change, except as noted in the HPI.   Physical Exam Updated Vital Signs BP (!) 146/87 (BP Location: Right Arm)   Pulse 71   Temp 98.2 F (36.8 C)   Resp 16   Ht  (1.753 m)  Wt 88.9 kg (196 lb)   LMP 12/18/2016   SpO2 100%   BMI 28.94 kg/m   Physical Exam  Constitutional: She is oriented to person, place, and time. She appears well-developed and well-nourished. No distress.  HENT:  Head: Normocephalic and atraumatic.  Eyes: Conjunctivae are normal. No scleral icterus.  Neck: Normal range of motion.  Cardiovascular: Normal rate, regular rhythm and normal heart sounds.  Exam reveals no gallop and no friction rub.   No murmur heard. Pulmonary/Chest: Effort normal and breath sounds normal. No respiratory distress.  Abdominal: Soft. Bowel sounds are normal. She exhibits no distension and no mass. There is tenderness. There is no guarding.  + psoas sign  Genitourinary:  Genitourinary  Comments: Pelvic exam: VULVA: normal appearing vulva with no masses, tenderness or lesions, VAGINA: normal appearing vagina with normal color and discharge, no lesions, CERVIX: normal appearing cervix without discharge or lesions, UTERUS: uterus is normal size, shape, consistency and nontender, ADNEXA: tenderness right.   Neurological: She is alert and oriented to person, place, and time.  Skin: Skin is warm and dry. She is not diaphoretic.  Psychiatric: Her behavior is normal.  Nursing note and vitals reviewed.    ED Treatments / Results  Labs (all labs ordered are listed, but only abnormal results are displayed) Labs Reviewed  URINALYSIS, ROUTINE W REFLEX MICROSCOPIC - Abnormal; Notable for the following:       Result Value   Leukocytes, UA TRACE (*)    All other components within normal limits  URINALYSIS, MICROSCOPIC (REFLEX) - Abnormal; Notable for the following:    Bacteria, UA MANY (*)    Squamous Epithelial / LPF 0-5 (*)    All other components within normal limits  PREGNANCY, URINE    EKG  EKG Interpretation None       Radiology No results found.  Procedures Procedures (including critical care time)  Medications Ordered in ED Medications - No data to display   Initial Impression / Assessment and Plan / ED Course  I have reviewed the triage vital signs and the nursing notes.  Pertinent labs & imaging results that were available during my care of the patient were reviewed by me and considered in my medical decision making (see chart for details).     A CT scan negative, labs reviewed without significant abnormality. Her pelvic ultrasound shows a corpus luteum cyst on the right ovary which may be the cause of her pain. Does not appear to be any emergent cause of her right lower quadrant pain. Urine is clear. Patient will be discharged with tramadol and Mobic. Advised patient to follow up with her OB/GYN. Her pain is greatly improved since this morning. She  presented for discharge at this time. Discussed return precautions.  Final Clinical Impressions(s) / ED Diagnoses   Final diagnoses:  None    New Prescriptions New Prescriptions   No medications on file     Arthor CaptainHarris, Daschel Roughton, PA-C 01/10/17 1727    Gwyneth SproutPlunkett, Whitney, MD 01/10/17 2052

## 2017-01-10 NOTE — ED Triage Notes (Signed)
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

## 2017-01-10 NOTE — ED Notes (Signed)
Pt still in US will obtain vitals when still return

## 2017-01-11 LAB — GC/CHLAMYDIA PROBE AMP (~~LOC~~) NOT AT ARMC
CHLAMYDIA, DNA PROBE: NEGATIVE
Neisseria Gonorrhea: NEGATIVE
